# Patient Record
Sex: Female | Born: 1965 | Race: White | Hispanic: No | Marital: Married | Smoking: Current every day smoker
Health system: Southern US, Community
[De-identification: ages and names within clinical notes are randomized; demographics above are authoritative.]

## PROBLEM LIST (undated history)

## (undated) DIAGNOSIS — M549 Dorsalgia, unspecified: Secondary | ICD-10-CM

## (undated) DIAGNOSIS — G8929 Other chronic pain: Secondary | ICD-10-CM

## (undated) DIAGNOSIS — Z720 Tobacco use: Secondary | ICD-10-CM

## (undated) DIAGNOSIS — F1020 Alcohol dependence, uncomplicated: Secondary | ICD-10-CM

## (undated) DIAGNOSIS — M5416 Radiculopathy, lumbar region: Secondary | ICD-10-CM

## (undated) DIAGNOSIS — K7460 Unspecified cirrhosis of liver: Secondary | ICD-10-CM

## (undated) DIAGNOSIS — R17 Unspecified jaundice: Secondary | ICD-10-CM

## (undated) HISTORY — PX: JOINT REPLACEMENT: SHX530

## (undated) HISTORY — PX: TONSILLECTOMY: SUR1361

## (undated) HISTORY — PX: LEG SURGERY: SHX1003

---

## 2016-02-18 ENCOUNTER — Encounter (INDEPENDENT_AMBULATORY_CARE_PROVIDER_SITE_OTHER): Payer: Self-pay

## 2016-02-18 ENCOUNTER — Encounter (INDEPENDENT_AMBULATORY_CARE_PROVIDER_SITE_OTHER): Payer: Self-pay | Admitting: *Deleted

## 2016-02-23 ENCOUNTER — Other Ambulatory Visit (HOSPITAL_COMMUNITY): Payer: Self-pay | Admitting: Family

## 2016-02-23 DIAGNOSIS — Z1231 Encounter for screening mammogram for malignant neoplasm of breast: Secondary | ICD-10-CM

## 2016-03-16 ENCOUNTER — Ambulatory Visit (HOSPITAL_COMMUNITY)
Admission: RE | Admit: 2016-03-16 | Discharge: 2016-03-16 | Disposition: A | Discharge status: Home / Self Care | Payer: 59 | Source: Ambulatory Visit | Attending: Family | Admitting: Family

## 2016-03-16 ENCOUNTER — Encounter (HOSPITAL_COMMUNITY): Payer: Self-pay | Admitting: Radiology

## 2016-03-16 DIAGNOSIS — Z1231 Encounter for screening mammogram for malignant neoplasm of breast: Secondary | ICD-10-CM | POA: Diagnosis not present

## 2016-09-08 ENCOUNTER — Emergency Department (HOSPITAL_COMMUNITY)
Admission: EM | Admit: 2016-09-08 | Discharge: 2016-09-08 | Disposition: A | Discharge status: Home / Self Care | Payer: 59 | Attending: Emergency Medicine | Admitting: Emergency Medicine

## 2016-09-08 ENCOUNTER — Emergency Department (HOSPITAL_COMMUNITY): Payer: 59

## 2016-09-08 ENCOUNTER — Encounter (HOSPITAL_COMMUNITY): Payer: Self-pay | Admitting: Emergency Medicine

## 2016-09-08 DIAGNOSIS — Z5181 Encounter for therapeutic drug level monitoring: Secondary | ICD-10-CM | POA: Diagnosis not present

## 2016-09-08 DIAGNOSIS — R791 Abnormal coagulation profile: Secondary | ICD-10-CM | POA: Insufficient documentation

## 2016-09-08 DIAGNOSIS — K7010 Alcoholic hepatitis without ascites: Secondary | ICD-10-CM | POA: Insufficient documentation

## 2016-09-08 DIAGNOSIS — F1010 Alcohol abuse, uncomplicated: Secondary | ICD-10-CM | POA: Diagnosis not present

## 2016-09-08 DIAGNOSIS — F17210 Nicotine dependence, cigarettes, uncomplicated: Secondary | ICD-10-CM | POA: Diagnosis not present

## 2016-09-08 DIAGNOSIS — E8809 Other disorders of plasma-protein metabolism, not elsewhere classified: Secondary | ICD-10-CM

## 2016-09-08 DIAGNOSIS — R2243 Localized swelling, mass and lump, lower limb, bilateral: Secondary | ICD-10-CM | POA: Diagnosis present

## 2016-09-08 LAB — COMPREHENSIVE METABOLIC PANEL
ALT: 42 U/L (ref 14–54)
AST: 121 U/L — ABNORMAL HIGH (ref 15–41)
Albumin: 1.7 g/dL — ABNORMAL LOW (ref 3.5–5.0)
Alkaline Phosphatase: 158 U/L — ABNORMAL HIGH (ref 38–126)
Anion gap: 7 (ref 5–15)
BILIRUBIN TOTAL: 9.8 mg/dL — AB (ref 0.3–1.2)
BUN: <5 mg/dL — ABNORMAL LOW (ref 6–20)
CO2: 28 mmol/L (ref 22–32)
Calcium: 8.1 mg/dL — ABNORMAL LOW (ref 8.9–10.3)
Chloride: 101 mmol/L (ref 101–111)
Creatinine, Ser: 0.47 mg/dL (ref 0.44–1.00)
GFR calc Af Amer: >60 mL/min (ref >60)
Glucose, Bld: 99 mg/dL (ref 65–99)
POTASSIUM: 3.7 mmol/L (ref 3.5–5.1)
Sodium: 136 mmol/L (ref 135–145)
TOTAL PROTEIN: 6.4 g/dL — AB (ref 6.5–8.1)

## 2016-09-08 LAB — PROTIME-INR
INR: 2.55
Prothrombin Time: 27.9 seconds — ABNORMAL HIGH (ref 11.4–15.2)

## 2016-09-08 LAB — CBC WITH DIFFERENTIAL/PLATELET
BASOS PCT: 2 %
Basophils Absolute: 0.1 10*3/uL (ref 0.0–0.1)
EOS ABS: 0.2 10*3/uL (ref 0.0–0.7)
Eosinophils Relative: 4 %
HEMATOCRIT: 36.1 % (ref 36.0–46.0)
HEMOGLOBIN: 11.7 g/dL — AB (ref 12.0–15.0)
Lymphocytes Relative: 10 %
Lymphs Abs: 0.5 10*3/uL — ABNORMAL LOW (ref 0.7–4.0)
MCH: 30.7 pg (ref 26.0–34.0)
MCHC: 32.4 g/dL (ref 30.0–36.0)
MCV: 94.8 fL (ref 78.0–100.0)
Monocytes Absolute: 1.4 10*3/uL — ABNORMAL HIGH (ref 0.1–1.0)
Monocytes Relative: 26 %
NEUTROS ABS: 3.1 10*3/uL (ref 1.7–7.7)
NEUTROS PCT: 58 %
Platelets: 247 10*3/uL (ref 150–400)
RBC: 3.81 MIL/uL — ABNORMAL LOW (ref 3.87–5.11)
RDW: 18.4 % — AB (ref 11.5–15.5)
WBC: 5.3 10*3/uL (ref 4.0–10.5)

## 2016-09-08 LAB — URINALYSIS, ROUTINE W REFLEX MICROSCOPIC
Glucose, UA: NEGATIVE mg/dL
HGB URINE DIPSTICK: NEGATIVE
Ketones, ur: NEGATIVE mg/dL
Leukocytes, UA: NEGATIVE
Nitrite: NEGATIVE
PH: 8 (ref 5.0–8.0)
PROTEIN: NEGATIVE mg/dL
Specific Gravity, Urine: 1.013 (ref 1.005–1.030)

## 2016-09-08 LAB — ETHANOL: Alcohol, Ethyl (B): <5 mg/dL (ref <5)

## 2016-09-08 LAB — LIPASE, BLOOD: LIPASE: 59 U/L — AB (ref 11–51)

## 2016-09-08 LAB — RAPID URINE DRUG SCREEN, HOSP PERFORMED
Amphetamines: NOT DETECTED
BARBITURATES: NOT DETECTED
BENZODIAZEPINES: NOT DETECTED
Cocaine: NOT DETECTED
OPIATES: NOT DETECTED
Tetrahydrocannabinol: NOT DETECTED

## 2016-09-08 LAB — CK: CK TOTAL: 691 U/L — AB (ref 38–234)

## 2016-09-08 NOTE — ED Triage Notes (Signed)
Pt reports being sent for abnormal labs with swelling in legs and abdomen.  Abnormal labs were bilirubin, AST, and CK.

## 2016-09-08 NOTE — ED Provider Notes (Signed)
[facility]-EMERGENCY DEPT Provider Note   CSN: [id number] Arrival date & time: 09/08/16  0813  By signing my name below, I, Whitney Mathis, attest that this documentation has been prepared under the direction and in the presence of Mancel BaleWentz, Lynn Recendiz, MD . Electronically Signed: Teofilo PodMatthew P. Mathis, ED Scribe. 09/08/2016. 8:54 AM.    History   Chief Complaint Chief Complaint  Patient presents with  . Leg Swelling    The history is provided by the patient. No language interpreter was used.   HPI Comments:  Whitney Mathis is a 51 y.o. female who presents to the Emergency Department, here to follow up after having abnormal labs at her PCP 3 days ago. Pt reports that she was referred to the ED after having abnormal bilirubin, AST and CK labs. Husband reports that pt regularly has leg and abdominal swelling, and she takes gabapentin regularly. She states that the swelling in her left leg has improved. Pt reports being told that she had Hep B 6 months ago, but then tested negative. She states that she is otherwise healthy, smokes 2 cigarettes/day, and drinks vodka daily. Denies fever, nausea, vomiting, cough, chest pain, abdominal pain, back pain, dizziness.   History reviewed. No pertinent past medical history.  There are no active problems to display for this patient.   Past Surgical History:  Procedure Laterality Date  . JOINT REPLACEMENT    . TONSILLECTOMY      OB History    No data available       Home Medications    Prior to Admission medications   Not on File    Family History History reviewed. No pertinent family history.  Social History Social History  Substance Use Topics  . Smoking status: Current Every Day Smoker    Packs/day: 0.50    Types: Cigarettes  . Smokeless tobacco: Never Used  . Alcohol use Yes     Comment: hx heavy etoh use, reduced recently     Allergies   Codeine   Review of Systems Review of Systems  Constitutional: Negative for fever.    Respiratory: Negative for cough.   Cardiovascular: Positive for leg swelling. Negative for chest pain.  Gastrointestinal: Negative for abdominal pain, diarrhea, nausea and vomiting.  Genitourinary: Negative for dysuria.  Neurological: Negative for dizziness.  All other systems reviewed and are negative.    Physical Exam Updated Vital Signs BP 116/68 (BP Location: Left Arm)   Pulse 70   Temp 97.8 F (36.6 C) (Oral)   Resp 15   Wt 75.8 kg (167 lb)   SpO2 98%   Physical Exam  Constitutional: She is oriented to person, place, and time. She appears well-developed and well-nourished. No distress.  HENT:  Head: Normocephalic and atraumatic.  Right Ear: External ear normal.  Left Ear: External ear normal.  Nose: Nose normal.  Mouth/Throat: Oropharynx is clear and moist.  Eyes: Conjunctivae and EOM are normal. Pupils are equal, round, and reactive to light. Scleral icterus is present.  Neck: Normal range of motion and phonation normal. Neck supple.  Cardiovascular: Normal rate and regular rhythm.   Pulmonary/Chest: Effort normal and breath sounds normal. No respiratory distress. She has no wheezes. She has no rales. She exhibits no tenderness.  Abdominal: Soft. She exhibits no distension. There is no tenderness. There is no rebound and no guarding.  Hyperactive bowel sounds.  No palpable liver edge or right upper quadrant tenderness.  No clear evidence for ascites.  Musculoskeletal: Normal range of motion.  3+ pitting edema to right leg. 2+ pitting edema to left leg.   Neurological: She is alert and oriented to person, place, and time. She exhibits normal muscle tone.  No dysarthria, aphasia or nystagmus.  No asterixis.  Skin: Skin is warm and dry. She is not diaphoretic.  Vague red rash arms and torso, characterized by blanching red, macules, 3-4 mm, irregular shaped.  Psychiatric: She has a normal mood and affect. Her behavior is normal. Judgment and thought content normal.   Nursing note and vitals reviewed.    ED Treatments / Results  DIAGNOSTIC STUDIES:  Oxygen Saturation is 99% on RA, normal by my interpretation.    COORDINATION OF CARE:  8:41 AM Discussed treatment plan with pt at bedside and pt agreed to plan.   Labs (all labs ordered are listed, but only abnormal results are displayed) Labs Reviewed  COMPREHENSIVE METABOLIC PANEL - Abnormal; Notable for the following:       Result Value   BUN <5 (*)    Calcium 8.1 (*)    Total Protein 6.4 (*)    Albumin 1.7 (*)    AST 121 (*)    Alkaline Phosphatase 158 (*)    Total Bilirubin 9.8 (*)    All other components within normal limits  LIPASE, BLOOD - Abnormal; Notable for the following:    Lipase 59 (*)    All other components within normal limits  CBC WITH DIFFERENTIAL/PLATELET - Abnormal; Notable for the following:    RBC 3.81 (*)    Hemoglobin 11.7 (*)    RDW 18.4 (*)    Lymphs Abs 0.5 (*)    Monocytes Absolute 1.4 (*)    All other components within normal limits  URINALYSIS, ROUTINE W REFLEX MICROSCOPIC - Abnormal; Notable for the following:    Color, Urine AMBER (*)    Bilirubin Urine MODERATE (*)    All other components within normal limits  PROTIME-INR - Abnormal; Notable for the following:    Prothrombin Time 27.9 (*)    All other components within normal limits  CK - Abnormal; Notable for the following:    Total CK 691 (*)    All other components within normal limits  RAPID URINE DRUG SCREEN, HOSP PERFORMED  ETHANOL    EKG  EKG Interpretation  Date/Time:  Thursday Sep 08 2016 09:05:53 EDT Ventricular Rate:  75 PR Interval:    QRS Duration: 117 QT Interval:  428 QTC Calculation: 479 R Axis:   78 Text Interpretation:  Sinus rhythm Incomplete right bundle branch block No old tracing to compare Confirmed by Mancel Bale [phone]) on 09/08/2016 10:00:51 AM       Radiology Dg Chest 2 View  Result Date: 09/08/2016 CLINICAL DATA:  Bilateral lower extremity swelling.  EXAM: CHEST  2 VIEW COMPARISON:  None. FINDINGS: No pneumothorax. Cardiomegaly identified. The hila and mediastinum are normal. No pulmonary nodules or masses. Probable scar or atelectasis in the left mid lung. No overt edema. IMPRESSION: Cardiomegaly.  No overt edema. Electronically Signed   By: Gerome Sam III M.D   On: 09/08/2016 09:52    Procedures Procedures (including critical care time)  Medications Ordered in ED Medications - No data to display   Initial Impression / Assessment and Plan / ED Course  I have reviewed the triage vital signs and the nursing notes.  Pertinent labs & imaging results that were available during my care of the patient were reviewed by me and considered in my medical decision making (  see chart for details).      Patient Vitals for the past 24 hrs:  BP Pulse Resp SpO2  09/08/16 1032 116/68 70 15 98 %  09/08/16 1000 115/75 65 15 98 %  09/08/16 0930 115/70 73 18 98 %  09/08/16 0830 112/66 74 - 97 %    At discharge- reevaluation with update and discussion. After initial assessment and treatment, an updated evaluation reveals she remains comfortable and has no further complaints.  Findings discussed with the patient.  She agrees that she needs to stop using alcohol.  All questions answered.Mancel Bale L    Final Clinical Impressions(s) / ED Diagnoses   Final diagnoses:  Hyperbilirubinemia  Alcohol abuse  Hypoalbuminemia  Alcoholic hepatitis, unspecified whether ascites present  Prolonged INR   Alcohol liver disease, with hyperbilirubinemia, and prolonged INR.  Likely has some cirrhosis with ascites.  Patient is nontoxic.  Doubt serious bacterial infection metabolic instability or impending vascular collapse.  Nursing Notes Reviewed/ Care Coordinated Applicable Imaging Reviewed Interpretation of Laboratory Data incorporated into ED treatment  The patient appears reasonably screened and/or stabilized for discharge and I doubt any other  medical condition or other [facility] requiring further screening, evaluation, or treatment in the ED at this time prior to discharge.  Plan: Home Medications-ibuprofen for pain; Home Treatments-rest, avoid alcohol, try to eat 3 well-balanced meals each day; return here if the recommended treatment, does not improve the symptoms; Recommended follow up-PCP for checkup 1 week    New Prescriptions There are no discharge medications for this patient. I personally performed the services described in this documentation, which was scribed in my presence. The recorded information has been reviewed and is accurate.     Mancel Bale, MD 09/09/16 [phone]

## 2016-09-08 NOTE — Discharge Instructions (Signed)
It is important to avoid all forms of alcohol.  Try to eat 3 regular meals each day.  Return here, if needed, for problems.

## 2016-12-13 ENCOUNTER — Emergency Department (HOSPITAL_COMMUNITY): Payer: 59

## 2016-12-13 ENCOUNTER — Encounter (HOSPITAL_COMMUNITY): Payer: Self-pay | Admitting: Emergency Medicine

## 2016-12-13 ENCOUNTER — Observation Stay (HOSPITAL_COMMUNITY)
Admission: EM | Admit: 2016-12-13 | Discharge: 2016-12-14 | Disposition: A | Discharge status: Home / Self Care | Payer: 59 | Attending: Family Medicine | Admitting: Family Medicine

## 2016-12-13 DIAGNOSIS — F17210 Nicotine dependence, cigarettes, uncomplicated: Secondary | ICD-10-CM | POA: Insufficient documentation

## 2016-12-13 DIAGNOSIS — K7460 Unspecified cirrhosis of liver: Secondary | ICD-10-CM | POA: Diagnosis not present

## 2016-12-13 DIAGNOSIS — Z716 Tobacco abuse counseling: Secondary | ICD-10-CM | POA: Diagnosis not present

## 2016-12-13 DIAGNOSIS — K0889 Other specified disorders of teeth and supporting structures: Secondary | ICD-10-CM | POA: Diagnosis present

## 2016-12-13 DIAGNOSIS — K047 Periapical abscess without sinus: Principal | ICD-10-CM | POA: Diagnosis present

## 2016-12-13 DIAGNOSIS — E872 Acidosis, unspecified: Secondary | ICD-10-CM

## 2016-12-13 DIAGNOSIS — K7031 Alcoholic cirrhosis of liver with ascites: Secondary | ICD-10-CM | POA: Diagnosis present

## 2016-12-13 DIAGNOSIS — Z79899 Other long term (current) drug therapy: Secondary | ICD-10-CM | POA: Insufficient documentation

## 2016-12-13 DIAGNOSIS — F17200 Nicotine dependence, unspecified, uncomplicated: Secondary | ICD-10-CM | POA: Diagnosis present

## 2016-12-13 HISTORY — DX: Unspecified cirrhosis of liver: K74.60

## 2016-12-13 HISTORY — DX: Alcohol dependence, uncomplicated: F10.20

## 2016-12-13 LAB — I-STAT CG4 LACTIC ACID, ED: Lactic Acid, Venous: 2.27 mmol/L (ref 0.5–1.9)

## 2016-12-13 LAB — CBC WITH DIFFERENTIAL/PLATELET
BASOS PCT: 1 %
Basophils Absolute: 0.1 10*3/uL (ref 0.0–0.1)
EOS PCT: 3 %
Eosinophils Absolute: 0.1 10*3/uL (ref 0.0–0.7)
HCT: 35.7 % — ABNORMAL LOW (ref 36.0–46.0)
HEMOGLOBIN: 11.5 g/dL — AB (ref 12.0–15.0)
LYMPHS ABS: 0.7 10*3/uL (ref 0.7–4.0)
LYMPHS PCT: 13 %
MCH: 30.3 pg (ref 26.0–34.0)
MCHC: 32.2 g/dL (ref 30.0–36.0)
MCV: 94.2 fL (ref 78.0–100.0)
MONO ABS: 1 10*3/uL (ref 0.1–1.0)
Monocytes Relative: 18 %
NEUTROS ABS: 3.7 10*3/uL (ref 1.7–7.7)
Neutrophils Relative %: 65 %
Platelets: 197 10*3/uL (ref 150–400)
RBC: 3.79 MIL/uL — ABNORMAL LOW (ref 3.87–5.11)
RDW: 18.4 % — AB (ref 11.5–15.5)
WBC: 5.5 10*3/uL (ref 4.0–10.5)

## 2016-12-13 LAB — LACTIC ACID, PLASMA
Lactic Acid, Venous: 1.4 mmol/L (ref 0.5–1.9)
Lactic Acid, Venous: 1.6 mmol/L (ref 0.5–1.9)

## 2016-12-13 LAB — URINALYSIS, ROUTINE W REFLEX MICROSCOPIC
Bilirubin Urine: NEGATIVE
GLUCOSE, UA: NEGATIVE mg/dL
Hgb urine dipstick: NEGATIVE
KETONES UR: NEGATIVE mg/dL
LEUKOCYTES UA: NEGATIVE
NITRITE: NEGATIVE
PH: 7 (ref 5.0–8.0)
Protein, ur: NEGATIVE mg/dL
Specific Gravity, Urine: 1.005 (ref 1.005–1.030)

## 2016-12-13 LAB — COMPREHENSIVE METABOLIC PANEL
ALBUMIN: 2.1 g/dL — AB (ref 3.5–5.0)
ALK PHOS: 131 U/L — AB (ref 38–126)
ALT: 44 U/L (ref 14–54)
AST: 119 U/L — ABNORMAL HIGH (ref 15–41)
Anion gap: 5 (ref 5–15)
BUN: <5 mg/dL — ABNORMAL LOW (ref 6–20)
CHLORIDE: 109 mmol/L (ref 101–111)
CO2: 23 mmol/L (ref 22–32)
Calcium: 8.2 mg/dL — ABNORMAL LOW (ref 8.9–10.3)
Creatinine, Ser: 0.3 mg/dL — ABNORMAL LOW (ref 0.44–1.00)
GFR calc Af Amer: >60 mL/min (ref >60)
GFR calc non Af Amer: >60 mL/min (ref >60)
Glucose, Bld: 103 mg/dL — ABNORMAL HIGH (ref 65–99)
POTASSIUM: 4 mmol/L (ref 3.5–5.1)
Sodium: 137 mmol/L (ref 135–145)
Total Bilirubin: 5.5 mg/dL — ABNORMAL HIGH (ref 0.3–1.2)
Total Protein: 6.3 g/dL — ABNORMAL LOW (ref 6.5–8.1)

## 2016-12-13 LAB — PROTIME-INR
INR: 1.86
Prothrombin Time: 21.3 seconds — ABNORMAL HIGH (ref 11.4–15.2)

## 2016-12-13 MED ORDER — GABAPENTIN 300 MG PO CAPS
ORAL_CAPSULE | ORAL | Status: AC
  Filled 2016-12-13: qty 2

## 2016-12-13 MED ORDER — ACETAMINOPHEN 325 MG PO TABS: 650.0000 mg | ORAL_TABLET | Freq: Four times a day (QID) | ORAL | Status: DC | PRN

## 2016-12-13 MED ORDER — DOCUSATE SODIUM 100 MG PO CAPS
100.0000 mg | ORAL_CAPSULE | Freq: Two times a day (BID) | ORAL | Status: DC
  Administered 2016-12-13 – 2016-12-14 (×2): 100 mg via ORAL
  Filled 2016-12-13 (×2): qty 1

## 2016-12-13 MED ORDER — ACETAMINOPHEN 650 MG RE SUPP: 650.0000 mg | Freq: Four times a day (QID) | RECTAL | Status: DC | PRN

## 2016-12-13 MED ORDER — PIPERACILLIN-TAZOBACTAM 3.375 G IVPB: 3.3750 g | Freq: Three times a day (TID) | INTRAVENOUS | Status: DC

## 2016-12-13 MED ORDER — OXYCODONE-ACETAMINOPHEN 5-325 MG PO TABS
1.0000 | ORAL_TABLET | Freq: Once | ORAL | Status: AC
  Administered 2016-12-13: 1 via ORAL
  Filled 2016-12-13: qty 1

## 2016-12-13 MED ORDER — IOPAMIDOL (ISOVUE-300) INJECTION 61%
75.0000 mL | Freq: Once | INTRAVENOUS | Status: AC | PRN
  Administered 2016-12-13: 75 mL via INTRAVENOUS

## 2016-12-13 MED ORDER — KETOROLAC TROMETHAMINE 30 MG/ML IJ SOLN
30.0000 mg | Freq: Four times a day (QID) | INTRAMUSCULAR | Status: DC | PRN
  Administered 2016-12-14: 30 mg via INTRAVENOUS
  Filled 2016-12-13: qty 1

## 2016-12-13 MED ORDER — SODIUM CHLORIDE 0.9 % IV BOLUS (SEPSIS)
1000.0000 mL | Freq: Once | INTRAVENOUS | Status: AC
  Administered 2016-12-13: 1000 mL via INTRAVENOUS

## 2016-12-13 MED ORDER — FUROSEMIDE 40 MG PO TABS
40.0000 mg | ORAL_TABLET | Freq: Every day | ORAL | Status: DC
  Administered 2016-12-13 – 2016-12-14 (×2): 40 mg via ORAL
  Filled 2016-12-13 (×2): qty 1

## 2016-12-13 MED ORDER — SODIUM CHLORIDE 0.9 % IV BOLUS (SEPSIS)
250.0000 mL | Freq: Once | INTRAVENOUS | Status: AC
  Administered 2016-12-13: 250 mL via INTRAVENOUS

## 2016-12-13 MED ORDER — CLINDAMYCIN PHOSPHATE 600 MG/50ML IV SOLN
600.0000 mg | Freq: Once | INTRAVENOUS | Status: AC
  Administered 2016-12-13: 600 mg via INTRAVENOUS
  Filled 2016-12-13: qty 50

## 2016-12-13 MED ORDER — ONDANSETRON HCL 4 MG/2ML IJ SOLN: 4.0000 mg | Freq: Four times a day (QID) | INTRAMUSCULAR | Status: DC | PRN

## 2016-12-13 MED ORDER — SODIUM CHLORIDE 0.9 % IV SOLN
INTRAVENOUS | Status: AC
  Filled 2016-12-13: qty 3

## 2016-12-13 MED ORDER — GABAPENTIN 600 MG PO TABS
600.0000 mg | ORAL_TABLET | Freq: Two times a day (BID) | ORAL | Status: DC
  Administered 2016-12-14: 600 mg via ORAL
  Filled 2016-12-13 (×2): qty 1

## 2016-12-13 MED ORDER — ONDANSETRON HCL 4 MG PO TABS: 4.0000 mg | ORAL_TABLET | Freq: Four times a day (QID) | ORAL | Status: DC | PRN

## 2016-12-13 MED ORDER — ENOXAPARIN SODIUM 40 MG/0.4ML ~~LOC~~ SOLN
40.0000 mg | SUBCUTANEOUS | Status: DC
  Administered 2016-12-13: 40 mg via SUBCUTANEOUS
  Filled 2016-12-13: qty 0.4

## 2016-12-13 MED ORDER — SODIUM CHLORIDE 0.9 % IV BOLUS (SEPSIS)
500.0000 mL | Freq: Once | INTRAVENOUS | Status: AC
  Administered 2016-12-13: 500 mL via INTRAVENOUS

## 2016-12-13 MED ORDER — VANCOMYCIN HCL 10 G IV SOLR
1250.0000 mg | Freq: Once | INTRAVENOUS | Status: AC
  Administered 2016-12-13: 1250 mg via INTRAVENOUS
  Filled 2016-12-13: qty 1250

## 2016-12-13 MED ORDER — VANCOMYCIN HCL IN DEXTROSE 1-5 GM/200ML-% IV SOLN: 1000.0000 mg | Freq: Two times a day (BID) | INTRAVENOUS | Status: DC

## 2016-12-13 MED ORDER — PIPERACILLIN-TAZOBACTAM 3.375 G IVPB 30 MIN
3.3750 g | Freq: Once | INTRAVENOUS | Status: AC
  Administered 2016-12-13: 3.375 g via INTRAVENOUS
  Filled 2016-12-13: qty 50

## 2016-12-13 MED ORDER — OXYCODONE HCL 5 MG PO TABS
5.0000 mg | ORAL_TABLET | Freq: Four times a day (QID) | ORAL | Status: DC | PRN
  Administered 2016-12-13 – 2016-12-14 (×2): 5 mg via ORAL
  Filled 2016-12-13 (×2): qty 1

## 2016-12-13 MED ORDER — LACTATED RINGERS IV SOLN
INTRAVENOUS | Status: DC
  Administered 2016-12-13: 23:00:00 via INTRAVENOUS

## 2016-12-13 MED ORDER — VANCOMYCIN HCL IN DEXTROSE 1-5 GM/200ML-% IV SOLN
1000.0000 mg | Freq: Once | INTRAVENOUS | Status: DC
  Filled 2016-12-13: qty 200

## 2016-12-13 MED ORDER — SODIUM CHLORIDE 0.9 % IV SOLN
3.0000 g | Freq: Three times a day (TID) | INTRAVENOUS | Status: DC
  Administered 2016-12-14: 3 g via INTRAVENOUS
  Filled 2016-12-13 (×5): qty 3

## 2016-12-13 NOTE — ED Notes (Signed)
Pt requesting to speak to someone about their wait time for a bed; this RN went in to speak with pt and family member; pt's family member is very upset and states if they are not going upstairs soon they are leaving and going to [facility]; Almira Coaster [facility] informed of need to speak with pt

## 2016-12-13 NOTE — ED Provider Notes (Signed)
[facility]-EMERGENCY DEPT Provider Note   CSN: [id number] Arrival date & time: 12/13/16  1132     History   Chief Complaint Chief Complaint  Patient presents with  . Dental Pain    HPI Whitney Mathis is a 51 y.o. female.  HPI  This is a 51 year old female history of cirrhosis presents today complaining of dental pain for the past week. She has a history of poor dentition and dental caries. She's been having pain at the site of tooth #11 which is decayed to the gumline. Surrounding teeth have multiple caries. She reports it has been tender and she has been on penicillin for the past week. She is almost completed the penicillin. This was prescribed by her primary care doctor. She states she's had some redness in the face and cheek overlying this with some swelling. She feels it has gotten somewhat worse. She denies any fever or chills. She is not having any difficulty speaking, swallowing, or breathing. She does not have a dentist or oral Careers adviser.  Past Medical History:  Diagnosis Date  . Cirrhosis (HCC)     There are no active problems to display for this patient.   Past Surgical History:  Procedure Laterality Date  . JOINT REPLACEMENT    . LEG SURGERY     right  . TONSILLECTOMY      OB History    No data available       Home Medications    Prior to Admission medications   Medication Sig Start Date End Date Taking? Authorizing Provider  furosemide (LASIX) 40 MG tablet Take 40 mg by mouth daily.  12/01/16  Yes [provider]  gabapentin (NEURONTIN) 600 MG tablet Take one tablet twice a day. 12/01/16  Yes [provider]  oxyCODONE (OXY IR/ROXICODONE) 5 MG immediate release tablet Take one three times a day as needed for pain. 12/06/16  Yes [provider]    Family History No family history on file.  Social History Social History  Substance Use Topics  . Smoking status: Current Every Day Smoker    Packs/day: 0.50    Types: Cigarettes  .  Smokeless tobacco: Never Used  . Alcohol use Yes     Comment: None in 4 months     Allergies   Codeine   Review of Systems Review of Systems  All other systems reviewed and are negative.    Physical Exam Updated Vital Signs BP (!) 142/89 (BP Location: Right Arm)   Pulse (!) 112   Temp 98.2 F (36.8 C) (Temporal)   Resp 18   Ht 1.626 m (5\' 4" )   Wt 71.7 kg (158 lb)   SpO2 100%   BMI 27.12 kg/m   Physical Exam  Constitutional: She is oriented to person, place, and time. She appears well-developed and well-nourished. No distress.  HENT:  Head: Normocephalic and atraumatic.    Mouth/Throat:    Erythema with mild swelling and tenderness to palpation  Eyes: Pupils are equal, round, and reactive to light.  Neck: Normal range of motion. Neck supple.  Cardiovascular: Normal rate and regular rhythm.   Murmur heard. Pulmonary/Chest: Effort normal and breath sounds normal.  Abdominal: Soft.  Musculoskeletal: Normal range of motion.  Neurological: She is alert and oriented to person, place, and time.  Skin: Skin is warm and dry. Capillary refill takes less than 2 seconds.  Psychiatric: She has a normal mood and affect.  Nursing note and vitals reviewed.    ED Treatments /  Results  Labs (all labs ordered are listed, but only abnormal results are displayed) Labs Reviewed  CBC WITH DIFFERENTIAL/PLATELET - Abnormal; Notable for the following:       Result Value   RBC 3.79 (*)    Hemoglobin 11.5 (*)    HCT 35.7 (*)    RDW 18.4 (*)    All other components within normal limits  COMPREHENSIVE METABOLIC PANEL - Abnormal; Notable for the following:    Glucose, Bld 103 (*)    BUN <5 (*)    Creatinine, Ser 0.30 (*)    Calcium 8.2 (*)    Total Protein 6.3 (*)    Albumin 2.1 (*)    AST 119 (*)    Alkaline Phosphatase 131 (*)    Total Bilirubin 5.5 (*)    All other components within normal limits  PROTIME-INR - Abnormal; Notable for the following:    Prothrombin Time  21.3 (*)    All other components within normal limits  CULTURE, BLOOD (ROUTINE X 2)  CULTURE, BLOOD (ROUTINE X 2)  I-STAT CG4 LACTIC ACID, ED    EKG  EKG Interpretation  Date/Time:  Tuesday December 13 2016 14:58:22 EDT Ventricular Rate:  94 PR Interval:    QRS Duration: 112 QT Interval:  377 QTC Calculation: 472 R Axis:   -28 Text Interpretation:  Sinus rhythm Borderline intraventricular conduction delay RSR' in V1 or V2, right VCD or RVH Confirmed by Margarita Grizzle [phone]) on 12/13/2016 3:30:48 PM       Radiology Ct Maxillofacial W Contrast  Result Date: 12/13/2016 CLINICAL DATA:  51 y/o  F; 3 days of left upper dental pain. EXAM: CT MAXILLOFACIAL WITH CONTRAST TECHNIQUE: Multidetector CT imaging of the maxillofacial structures was performed with intravenous contrast. Multiplanar CT image reconstructions were also generated. CONTRAST:  75mL ISOVUE-300 IOPAMIDOL (ISOVUE-300) INJECTION 61% COMPARISON:  None. FINDINGS: Osseous: Periapical cyst at the roots of left maxillary canine with cortical defect in the outer table of the maxillary alveolar bone (series 7, image 48). Orbits: Negative. No traumatic or inflammatory finding. Sinuses: Mild left maxillary sinus mucosal thickening and small mucous retention cyst. Normal aeration of the mastoid air cells. Soft tissues: 14 x 11 x 12 mm rim enhancing collection centered over left maxillary alveolar bone associated with the left maxillary canine at the base of left nostril. Limited intracranial: No significant or unexpected finding. IMPRESSION: 14 mm odontogenic abscess within soft tissues overlying the left maxillary alveolar bone at base of left nostril associated with a dehiscent periapical cysts of the left maxillary canine. Electronically Signed   By: Mitzi Hansen M.D.   On: 12/13/2016 14:56    Procedures Procedures (including critical care time)  Medications Ordered in ED Medications  clindamycin (CLEOCIN) IVPB 600 mg (600 mg  Intravenous New Bag/Given 12/13/16 1326)  sodium chloride 0.9 % bolus 500 mL (0 mLs Intravenous Stopped 12/13/16 1325)     Initial Impression / Assessment and Plan / ED Course  I have reviewed the triage vital signs and the nursing notes.  Pertinent labs & imaging results that were available during my care of the patient were reviewed by me and considered in my medical decision making (see chart for details).   labs drawn and IV clindamycin infused. CT face pending to assess for abscess.   Discussed with Dr. Ophelia Charter and plan admission for iv antibiotics and to assess for worsening infection.  Initial lactic acid slightly elevated.  Fluids infusing and will reassess for clearance vs worsening.  Final Clinical Impressions(s) / ED Diagnoses   Final diagnoses:  Dental abscess  Lactic acidosis    New Prescriptions New Prescriptions   No medications on file     Margarita Grizzle, MD 12/15/16 1600

## 2016-12-13 NOTE — Progress Notes (Addendum)
Pharmacy Antibiotic Note  Whitney Mathis is a 51 y.o. female admitted on 12/13/2016 with sepsis.  Pharmacy has been consulted for vancomycin and zosyn dosing. She received 600 mg clindamycin in the ED  Plan: Vancomycin 1250 mg IV X 1 then Vancomycin 1000 IV every 12 hours.  Goal trough 15-20 mcg/mL. Zosyn 3.375g IV q8h (4 hour infusion).  F/u renal function, cultures and clinical course  Height: 5\' 4"  (162.6 cm) Weight: 158 lb (71.7 kg) IBW/kg (Calculated) : 54.7  Temp (24hrs), Avg:98.2 F (36.8 C), Min:98.2 F (36.8 C), Max:98.2 F (36.8 C)   Recent Labs Lab 12/13/16 1307 12/13/16 1440 12/13/16 1501  WBC 5.5  --   --   CREATININE 0.30*  --   --   LATICACIDVEN  --  2.27* 1.4    Estimated Creatinine Clearance: 81.7 mL/min (A) (by C-G formula based on SCr of 0.3 mg/dL (L)).    Allergies  Allergen Reactions  . Codeine Nausea And Vomiting      Thank you for allowing pharmacy to be a part of this patient's care.  Woodfin Ganja 12/13/2016 4:36 PM   Addum:  Change antibiotics to unasyn 3gm IV q8 hours

## 2016-12-13 NOTE — H&P (Addendum)
History and Physical    Whitney Mathis ZOX:[id number] DOB: 1966-01-28 DOA: 12/13/2016  [facility] Personal Consultants:  Drake Leach and Ron Agee - [facility] Patient coming from: Home - lives with husband; NOK: husband, [phone]  Chief Complaint: dental pain  HPI: Whitney Mathis is a 51 y.o. female with medical history significant of alcoholic cirrhosis presenting with dental pain.  Patient with a tooth that has been broken for a long time - maybe 3-4 weeks.  It started swelling and she went to PCP a week ago and was started on PCN and Oxycodone; this did not help at all.  She has not seen a dentist for this.  She remotely saw Dr. Effie Shy to have a tooth cut out but he doesn't take her dental insurance.  She called the PCP office last evening and told her that she wasn't getting better.  It has been getting worse - red, more swollen.  No fever.  She notices mild improvement here in the ER.   ED Course: IV Clindamycin.  Review of Systems: As per HPI; otherwise review of systems reviewed and negative.   Ambulatory Status:  Ambulates without assistance  Past Medical History:  Diagnosis Date  . Alcohol dependence (HCC)    stopped drinking in 4/18  . Cirrhosis [facility])     Past Surgical History:  Procedure Laterality Date  . JOINT REPLACEMENT Right    hip  . LEG SURGERY     right  . TONSILLECTOMY      Social History   Social History  . Marital status: Married    Spouse name: N/A  . Number of children: N/A  . Years of education: N/A   Occupational History  . housewife    Social History Main Topics  . Smoking status: Current Every Day Smoker    Packs/day: 0.25    Types: Cigarettes    Start date: 52  . Smokeless tobacco: Never Used  . Alcohol use Yes     Comment: Quit in 07/2016  . Drug use: No  . Sexual activity: Not on file   Other Topics Concern  . Not on file   Social History Narrative  . No narrative on file    Allergies    Allergen Reactions  . Codeine Nausea And Vomiting    Family History  Problem Relation Age of Onset  . Bone cancer Mother 3    Prior to Admission medications   Medication Sig Start Date End Date Taking? Authorizing Provider  furosemide (LASIX) 40 MG tablet Take 40 mg by mouth daily.  12/01/16  Yes [provider]  gabapentin (NEURONTIN) 600 MG tablet Take one tablet twice a day. 12/01/16  Yes [provider]  oxyCODONE (OXY IR/ROXICODONE) 5 MG immediate release tablet Take one three times a day as needed for pain. 12/06/16  Yes [provider]    Physical Exam: Vitals:   12/13/16 1730 12/13/16 1745 12/13/16 1800 12/13/16 2136  BP: 140/86 134/86 126/87 116/67  Pulse: 89 88 90 95  Resp: 16 17 20 17   Temp:    98.2 F (36.8 C)  TempSrc:    Oral  SpO2: 98% 96% 97% 98%  Weight:      Height:         General:  Appears calm and comfortable and is NAD Eyes:  PERRL, EOMI, normal lids, iris ENT:  grossly normal hearing, lips & tongue, mmm; poor dentition with broken teeth on the right upper jaw and apparent  abscess of the left upper canine Neck:  Shotty left-sided LAD, no masses or thyromegaly; no carotid bruits Cardiovascular:  RRR, 2-3/6 systolic murmur, no r/g. No LE edema.  Respiratory:   CTA bilaterally with no wheezes/rales/rhonchi.  Normal respiratory effort. Abdomen:  soft, NT, ND, NABS, +fluid wave Back:   normal alignment, no CVAT Skin:  Mild facial fullness on the right without apparent erythema Musculoskeletal:  grossly normal tone BUE/BLE, good ROM, no bony abnormality Lower extremity:  No LE edema.  Limited foot exam with no ulcerations.  2+ distal pulses. Psychiatric:  grossly normal mood and affect, speech fluent and appropriate, AOx3 Neurologic:  CN 2-12 grossly intact, moves all extremities in coordinated fashion, sensation intact    Radiological Exams on Admission: Ct Maxillofacial W Contrast  Result Date: 12/13/2016 CLINICAL DATA:   51 y/o  F; 3 days of left upper dental pain. EXAM: CT MAXILLOFACIAL WITH CONTRAST TECHNIQUE: Multidetector CT imaging of the maxillofacial structures was performed with intravenous contrast. Multiplanar CT image reconstructions were also generated. CONTRAST:  19mL ISOVUE-300 IOPAMIDOL (ISOVUE-300) INJECTION 61% COMPARISON:  None. FINDINGS: Osseous: Periapical cyst at the roots of left maxillary canine with cortical defect in the outer table of the maxillary alveolar bone (series 7, image 48). Orbits: Negative. No traumatic or inflammatory finding. Sinuses: Mild left maxillary sinus mucosal thickening and small mucous retention cyst. Normal aeration of the mastoid air cells. Soft tissues: 14 x 11 x 12 mm rim enhancing collection centered over left maxillary alveolar bone associated with the left maxillary canine at the base of left nostril. Limited intracranial: No significant or unexpected finding. IMPRESSION: 14 mm odontogenic abscess within soft tissues overlying the left maxillary alveolar bone at base of left nostril associated with a dehiscent periapical cysts of the left maxillary canine. Electronically Signed   By: Mitzi Hansen M.D.   On: 12/13/2016 14:56    EKG: Independently reviewed.  NSR with rate 94; no evidence of acute ischemia   Labs on Admission: I have personally reviewed the available labs and imaging studies at the time of the admission.  Pertinent labs:   Lactate 2.27, 1.4, 1.6 Negative UA Albumin 2.1 - improved AST 119/ALT 44 - stable Bilirubin 5.5 - improved WBC 5.5 Hgb 11.5 - stable   Assessment/Plan Principal Problem:   Dental abscess Active Problems:   Alcoholic cirrhosis of liver with ascites (HCC)   Dental abscess -Patient presenting with elevated lactate associated with dental abscess -No other concern for sepsis -Will observe with IV antibiotics - she already failed outpatient PCN so will give Unasyn -If improving, she can f/u with oral surgeon as  an outpatient -If not improving, she will require transfer to a facility with this availability -Continue Oxy IR and Tylenol prn pain  Cirrhosis -She has ascites but no jaundice and reports feeling much better -Will check ETOH and UDS -Supportive care only other than continuing home Lasix  Tobacco dependence -Tobacco Dependence: encourage cessation.  This was discussed with the patient and should be reviewed on an ongoing basis.   -Patch declined by patient.   DVT prophylaxis: Lovenox  Code Status: Full - confirmed with patient Family Communication: None present Disposition Plan:  Home once clinically improved Consults called: None  Admission status: It is my clinical opinion that referral for OBSERVATION is reasonable and necessary in this patient based on the above information provided. The aforementioned taken together are felt to place the patient at high risk for further clinical deterioration. However it is anticipated  that the patient may be medically stable for discharge from the hospital within 24 to 48 hours.    Jonah Blue MD [facility]  If note is complete, please contact covering daytime or nighttime physician. [email or web address] Password [facility]  12/13/2016, 11:27 PM

## 2016-12-13 NOTE — ED Triage Notes (Signed)
Dental pain, upper left, on PCN, without relief

## 2016-12-13 NOTE — ED Notes (Signed)
MD states no additional ABX need to be given.

## 2016-12-14 DIAGNOSIS — K047 Periapical abscess without sinus: Secondary | ICD-10-CM | POA: Diagnosis not present

## 2016-12-14 LAB — CBC
HEMATOCRIT: 34.3 % — AB (ref 36.0–46.0)
HEMOGLOBIN: 11 g/dL — AB (ref 12.0–15.0)
MCH: 30.3 pg (ref 26.0–34.0)
MCHC: 32.1 g/dL (ref 30.0–36.0)
MCV: 94.5 fL (ref 78.0–100.0)
PLATELETS: 175 10*3/uL (ref 150–400)
RBC: 3.63 MIL/uL — AB (ref 3.87–5.11)
RDW: 18.2 % — ABNORMAL HIGH (ref 11.5–15.5)
WBC: 5.6 10*3/uL (ref 4.0–10.5)

## 2016-12-14 LAB — RAPID URINE DRUG SCREEN, HOSP PERFORMED
AMPHETAMINES: NOT DETECTED
BARBITURATES: NOT DETECTED
BENZODIAZEPINES: NOT DETECTED
Cocaine: NOT DETECTED
Opiates: NOT DETECTED
TETRAHYDROCANNABINOL: NOT DETECTED

## 2016-12-14 LAB — BASIC METABOLIC PANEL
Anion gap: 6 (ref 5–15)
BUN: <5 mg/dL — ABNORMAL LOW (ref 6–20)
CHLORIDE: 107 mmol/L (ref 101–111)
CO2: 23 mmol/L (ref 22–32)
Calcium: 7.6 mg/dL — ABNORMAL LOW (ref 8.9–10.3)
Creatinine, Ser: 0.41 mg/dL — ABNORMAL LOW (ref 0.44–1.00)
GFR calc Af Amer: >60 mL/min (ref >60)
GFR calc non Af Amer: >60 mL/min (ref >60)
Glucose, Bld: 78 mg/dL (ref 65–99)
POTASSIUM: 3.8 mmol/L (ref 3.5–5.1)
Sodium: 136 mmol/L (ref 135–145)

## 2016-12-14 LAB — ETHANOL: ALCOHOL ETHYL (B): 6 mg/dL — AB (ref <5)

## 2016-12-14 MED ORDER — GABAPENTIN 300 MG PO CAPS
600.0000 mg | ORAL_CAPSULE | Freq: Two times a day (BID) | ORAL | Status: DC
  Administered 2016-12-14: 600 mg via ORAL
  Filled 2016-12-14 (×2): qty 2

## 2016-12-14 MED ORDER — AMOXICILLIN-POT CLAVULANATE 875-125 MG PO TABS: 1.0000 | ORAL_TABLET | Freq: Two times a day (BID) | ORAL | 0 refills | Status: DC

## 2016-12-14 MED ORDER — HYDROCODONE-ACETAMINOPHEN 5-325 MG PO TABS: 1.0000 | ORAL_TABLET | Freq: Four times a day (QID) | ORAL | 0 refills | Status: DC | PRN

## 2016-12-14 NOTE — Progress Notes (Signed)
Pharmacy Antibiotic Note  Whitney Mathis is a 51 y.o. female admitted on 12/13/2016 with sepsis / dental infection.  Pharmacy has been consulted for UNASYN She received 600 mg clindamycin in the ED  Plan: Unasyn 3gm IV q8hrs F/u renal function, cultures and clinical course  Height: 5\' 4"  (162.6 cm) Weight: 158 lb (71.7 kg) IBW/kg (Calculated) : 54.7  Temp (24hrs), Avg:98.2 F (36.8 C), Min:98.2 F (36.8 C), Max:98.2 F (36.8 C)   Recent Labs Lab 12/13/16 1307 12/13/16 1440 12/13/16 1501 12/13/16 1722 12/14/16 0443  WBC 5.5  --   --   --  5.6  CREATININE 0.30*  --   --   --  0.41*  LATICACIDVEN  --  2.27* 1.4 1.6  --     Estimated Creatinine Clearance: 81.7 mL/min (A) (by C-G formula based on SCr of 0.41 mg/dL (L)).    Allergies  Allergen Reactions  . Codeine Nausea And Vomiting   Thank you for allowing pharmacy to be a part of this patient's care.  Valrie HartHall, [place] A 12/14/2016 10:28 AM

## 2016-12-14 NOTE — Progress Notes (Signed)
Both IV's removed.  Both sites clean, dry and intact.  Discharge information / AVS discussed with patient and husband.   New prescriptions given to patient in discharge packet.    Patient wheeled down to car by myself.  Patient stable at time of discharge.

## 2016-12-14 NOTE — Discharge Summary (Signed)
Physician Discharge Summary  Whitney Mathis VOZ:[id number] DOB: 06/01/1965 DOA: 12/13/2016  PCP: [facility]  Admit date: 12/13/2016 Discharge date: 12/14/2016  Recommendations for Outpatient Follow-up:  1. Resolution of dental abscess.  Follow-up Information    [facility] Follow up.   Why:  as needed Contact information: [address] [place] [address] [phone]            Discharge Diagnoses:  1. Dental abscess 2. Cirrhosis  Discharge Condition: Improved Disposition: Home  Diet recommendation: Heart healthy  Filed Weights   12/13/16 1139  Weight: 71.7 kg (158 lb)    History of present illness:  51 year old woman PMH cirrhosis admitted for dental abscess.  Hospital Course:  Patient was started on empiric Unasyn. She had significant clinical improvement with this with decreased pain and swelling. She was discharged home on oral Augmentin and advised to contact her dentist immediately to arrange for definitive management with extraction. I discussed with both her and her husband that antibiotics would not cure this infection and that she requires dental medicine for definitive management. She reports that she has dentist that she can call to assist her with this.  Today's assessment: S: Feels better, less pain, less swelling. No difficulty eating. O: Vitals: Afebrile, 98.2, 18, 94, 128/70, 99% on room air   Constitutional. Appears calm, comfortable.  ENT. There is some edema over the left nasolabial fold, this is tender to palpation. There is no wound or exudate. Mouth there is a carey at the gum left upper mandible. No exudate or swelling of the gum.  Cardiovascular. Regular rate and rhythm. No murmur, rub or gallop. No lower extremity edema.  Respiratory. Clear to auscultation bilaterally. No wheezes, rales or rhonchi. Normal respiratory effort.  Discharge Instructions  Discharge  Instructions    Diet - low sodium heart healthy    Complete by:  As directed    Discharge instructions    Complete by:  As directed    It is critical that you call your dentist to set up tooth care as soon as possible. You need to have your tooth extracted in order to cure this infection. Call your physician or seek immediate medical attention for increased pain, swelling, fever or worsening of condition.   Increase activity slowly    Complete by:  As directed      Allergies as of 12/14/2016      Reactions   Codeine Nausea And Vomiting      Medication List    STOP taking these medications   oxyCODONE 5 MG immediate release tablet Commonly known as:  Oxy IR/ROXICODONE     TAKE these medications   amoxicillin-clavulanate 875-125 MG tablet Commonly known as:  AUGMENTIN Take 1 tablet by mouth 2 (two) times daily.   furosemide 40 MG tablet Commonly known as:  LASIX Take 40 mg by mouth daily.   gabapentin 600 MG tablet Commonly known as:  NEURONTIN Take one tablet twice a day.   HYDROcodone-acetaminophen 5-325 MG tablet Commonly known as:  LORTAB Take 1 tablet by mouth every 6 (six) hours as needed for moderate pain.            Discharge Care Instructions        Start     Ordered   12/14/16 0000  amoxicillin-clavulanate (AUGMENTIN) 875-125 MG tablet  2 times daily     12/14/16 1258   12/14/16 0000  HYDROcodone-acetaminophen (LORTAB) 5-325 MG tablet  Every 6 hours PRN     12/14/16 1327   12/14/16 0000  Increase activity slowly     12/14/16 1329   12/14/16 0000  Diet - low sodium heart healthy     12/14/16 1329   12/14/16 0000  Discharge instructions    Comments:  It is critical that you call your dentist to set up tooth care as soon as possible. You need to have your tooth extracted in order to cure this infection. Call your physician or seek immediate medical attention for increased pain, swelling, fever or worsening of condition.   12/14/16 1329     Allergies    Allergen Reactions  . Codeine Nausea And Vomiting    The results of significant diagnostics from this hospitalization (including imaging, microbiology, ancillary and laboratory) are listed below for reference.    Significant Diagnostic Studies: Ct Maxillofacial W Contrast  Result Date: 12/13/2016 CLINICAL DATA:  51 y/o  F; 3 days of left upper dental pain. EXAM: CT MAXILLOFACIAL WITH CONTRAST TECHNIQUE: Multidetector CT imaging of the maxillofacial structures was performed with intravenous contrast. Multiplanar CT image reconstructions were also generated. CONTRAST:  75mL ISOVUE-300 IOPAMIDOL (ISOVUE-300) INJECTION 61% COMPARISON:  None. FINDINGS: Osseous: Periapical cyst at the roots of left maxillary canine with cortical defect in the outer table of the maxillary alveolar bone (series 7, image 48). Orbits: Negative. No traumatic or inflammatory finding. Sinuses: Mild left maxillary sinus mucosal thickening and small mucous retention cyst. Normal aeration of the mastoid air cells. Soft tissues: 14 x 11 x 12 mm rim enhancing collection centered over left maxillary alveolar bone associated with the left maxillary canine at the base of left nostril. Limited intracranial: No significant or unexpected finding. IMPRESSION: 14 mm odontogenic abscess within soft tissues overlying the left maxillary alveolar bone at base of left nostril associated with a dehiscent periapical cysts of the left maxillary canine. Electronically Signed   By: Mitzi HansenLance  Furusawa-Stratton M.D.   On: 12/13/2016 14:56    Microbiology: Recent Results (from the past 240 hour(s))  Blood culture (routine x 2)     Status: None (Preliminary result)   Collection Time: 12/13/16  1:07 PM  Result Value Ref Range Status   Specimen Description LEFT ANTECUBITAL  Final   Special Requests   Final    BOTTLES DRAWN AEROBIC ONLY Blood Culture results may not be optimal due to an inadequate volume of blood received in culture bottles   Culture NO  GROWTH < 24 HOURS  Final   Report Status PENDING  Incomplete  Blood culture (routine x 2)     Status: None (Preliminary result)   Collection Time: 12/13/16  1:07 PM  Result Value Ref Range Status   Specimen Description BLOOD LEFT ARM  Final   Special Requests   Final    BOTTLES DRAWN AEROBIC ONLY Blood Culture results may not be optimal due to an inadequate volume of blood received in culture bottles   Culture NO GROWTH < 24 HOURS  Final   Report Status PENDING  Incomplete     Labs: Basic Metabolic Panel:  Recent Labs Lab 12/13/16 1307 12/14/16 0443  NA 137 136  K 4.0 3.8  CL 109 107  CO2 23 23  GLUCOSE 103* 78  BUN <5* <5*  CREATININE 0.30* 0.41*  CALCIUM 8.2* 7.6*   Liver Function Tests:  Recent Labs Lab 12/13/16 1307  AST 119*  ALT 44  ALKPHOS 131*  BILITOT 5.5*  PROT 6.3*  ALBUMIN 2.1*  CBC:  Recent Labs Lab 12/13/16 1307 12/14/16 0443  WBC 5.5 5.6  NEUTROABS 3.7  --   HGB 11.5* 11.0*  HCT 35.7* 34.3*  MCV 94.2 94.5  PLT 197 175    Principal Problem:   Dental abscess Active Problems:   Alcoholic cirrhosis of liver with ascites (HCC)   Tobacco dependence   Time coordinating discharge: 35 minutes  Signed:  Brendia Sacks, MD [facility] 12/14/2016, 6:05 PM

## 2016-12-15 LAB — HIV ANTIBODY (ROUTINE TESTING W REFLEX): HIV Screen 4th Generation wRfx: NONREACTIVE

## 2016-12-18 LAB — CULTURE, BLOOD (ROUTINE X 2)
CULTURE: NO GROWTH
Culture: NO GROWTH

## 2017-06-25 ENCOUNTER — Inpatient Hospital Stay (HOSPITAL_COMMUNITY): Payer: BLUE CROSS/BLUE SHIELD

## 2017-06-25 ENCOUNTER — Inpatient Hospital Stay
Admission: AD | Admit: 2017-06-25 | Payer: Self-pay | Source: Other Acute Inpatient Hospital | Admitting: Pulmonary Disease

## 2017-06-25 ENCOUNTER — Encounter (HOSPITAL_COMMUNITY): Payer: Self-pay | Admitting: Emergency Medicine

## 2017-06-25 ENCOUNTER — Emergency Department (HOSPITAL_COMMUNITY): Payer: BLUE CROSS/BLUE SHIELD

## 2017-06-25 ENCOUNTER — Inpatient Hospital Stay (HOSPITAL_COMMUNITY): Payer: BLUE CROSS/BLUE SHIELD | Admitting: Anesthesiology

## 2017-06-25 ENCOUNTER — Inpatient Hospital Stay (HOSPITAL_COMMUNITY)
Admission: EM | Admit: 2017-06-25 | Discharge: 2017-07-17 | DRG: 853 | Disposition: E | Payer: BLUE CROSS/BLUE SHIELD | Attending: Emergency Medicine | Admitting: Emergency Medicine

## 2017-06-25 ENCOUNTER — Encounter (HOSPITAL_COMMUNITY): Admission: EM | Disposition: E | Payer: Self-pay | Source: Home / Self Care | Attending: Critical Care Medicine

## 2017-06-25 ENCOUNTER — Other Ambulatory Visit: Payer: Self-pay

## 2017-06-25 DIAGNOSIS — K7201 Acute and subacute hepatic failure with coma: Secondary | ICD-10-CM

## 2017-06-25 DIAGNOSIS — D62 Acute posthemorrhagic anemia: Secondary | ICD-10-CM | POA: Diagnosis not present

## 2017-06-25 DIAGNOSIS — I342 Nonrheumatic mitral (valve) stenosis: Secondary | ICD-10-CM | POA: Diagnosis not present

## 2017-06-25 DIAGNOSIS — J9602 Acute respiratory failure with hypercapnia: Secondary | ICD-10-CM

## 2017-06-25 DIAGNOSIS — A419 Sepsis, unspecified organism: Principal | ICD-10-CM | POA: Diagnosis present

## 2017-06-25 DIAGNOSIS — I743 Embolism and thrombosis of arteries of the lower extremities: Secondary | ICD-10-CM | POA: Diagnosis present

## 2017-06-25 DIAGNOSIS — I998 Other disorder of circulatory system: Secondary | ICD-10-CM | POA: Diagnosis not present

## 2017-06-25 DIAGNOSIS — G8929 Other chronic pain: Secondary | ICD-10-CM

## 2017-06-25 DIAGNOSIS — D649 Anemia, unspecified: Secondary | ICD-10-CM | POA: Diagnosis not present

## 2017-06-25 DIAGNOSIS — K7290 Hepatic failure, unspecified without coma: Secondary | ICD-10-CM | POA: Diagnosis not present

## 2017-06-25 DIAGNOSIS — I21A1 Myocardial infarction type 2: Secondary | ICD-10-CM | POA: Diagnosis present

## 2017-06-25 DIAGNOSIS — I4891 Unspecified atrial fibrillation: Secondary | ICD-10-CM | POA: Diagnosis present

## 2017-06-25 DIAGNOSIS — I10 Essential (primary) hypertension: Secondary | ICD-10-CM | POA: Diagnosis not present

## 2017-06-25 DIAGNOSIS — D684 Acquired coagulation factor deficiency: Secondary | ICD-10-CM | POA: Diagnosis not present

## 2017-06-25 DIAGNOSIS — E875 Hyperkalemia: Secondary | ICD-10-CM | POA: Diagnosis present

## 2017-06-25 DIAGNOSIS — Z66 Do not resuscitate: Secondary | ICD-10-CM | POA: Diagnosis not present

## 2017-06-25 DIAGNOSIS — Z9911 Dependence on respirator [ventilator] status: Secondary | ICD-10-CM

## 2017-06-25 DIAGNOSIS — Z791 Long term (current) use of non-steroidal anti-inflammatories (NSAID): Secondary | ICD-10-CM

## 2017-06-25 DIAGNOSIS — K7040 Alcoholic hepatic failure without coma: Secondary | ICD-10-CM | POA: Diagnosis present

## 2017-06-25 DIAGNOSIS — F10239 Alcohol dependence with withdrawal, unspecified: Secondary | ICD-10-CM | POA: Diagnosis present

## 2017-06-25 DIAGNOSIS — D696 Thrombocytopenia, unspecified: Secondary | ICD-10-CM | POA: Diagnosis present

## 2017-06-25 DIAGNOSIS — Z96641 Presence of right artificial hip joint: Secondary | ICD-10-CM | POA: Diagnosis present

## 2017-06-25 DIAGNOSIS — Z809 Family history of malignant neoplasm, unspecified: Secondary | ICD-10-CM

## 2017-06-25 DIAGNOSIS — N390 Urinary tract infection, site not specified: Secondary | ICD-10-CM | POA: Diagnosis present

## 2017-06-25 DIAGNOSIS — K922 Gastrointestinal hemorrhage, unspecified: Secondary | ICD-10-CM | POA: Diagnosis not present

## 2017-06-25 DIAGNOSIS — M79A22 Nontraumatic compartment syndrome of left lower extremity: Secondary | ICD-10-CM | POA: Diagnosis present

## 2017-06-25 DIAGNOSIS — J9600 Acute respiratory failure, unspecified whether with hypoxia or hypercapnia: Secondary | ICD-10-CM

## 2017-06-25 DIAGNOSIS — K7460 Unspecified cirrhosis of liver: Secondary | ICD-10-CM | POA: Diagnosis not present

## 2017-06-25 DIAGNOSIS — K7031 Alcoholic cirrhosis of liver with ascites: Secondary | ICD-10-CM | POA: Diagnosis present

## 2017-06-25 DIAGNOSIS — J9601 Acute respiratory failure with hypoxia: Secondary | ICD-10-CM

## 2017-06-25 DIAGNOSIS — I700 Atherosclerosis of aorta: Secondary | ICD-10-CM | POA: Diagnosis present

## 2017-06-25 DIAGNOSIS — J80 Acute respiratory distress syndrome: Secondary | ICD-10-CM | POA: Diagnosis not present

## 2017-06-25 DIAGNOSIS — R7989 Other specified abnormal findings of blood chemistry: Secondary | ICD-10-CM

## 2017-06-25 DIAGNOSIS — N170 Acute kidney failure with tubular necrosis: Secondary | ICD-10-CM

## 2017-06-25 DIAGNOSIS — Z419 Encounter for procedure for purposes other than remedying health state, unspecified: Secondary | ICD-10-CM

## 2017-06-25 DIAGNOSIS — R198 Other specified symptoms and signs involving the digestive system and abdomen: Secondary | ICD-10-CM

## 2017-06-25 DIAGNOSIS — Z452 Encounter for adjustment and management of vascular access device: Secondary | ICD-10-CM

## 2017-06-25 DIAGNOSIS — Z515 Encounter for palliative care: Secondary | ICD-10-CM | POA: Diagnosis not present

## 2017-06-25 DIAGNOSIS — I214 Non-ST elevation (NSTEMI) myocardial infarction: Secondary | ICD-10-CM | POA: Diagnosis not present

## 2017-06-25 DIAGNOSIS — F17210 Nicotine dependence, cigarettes, uncomplicated: Secondary | ICD-10-CM | POA: Diagnosis present

## 2017-06-25 DIAGNOSIS — B958 Unspecified staphylococcus as the cause of diseases classified elsewhere: Secondary | ICD-10-CM | POA: Diagnosis present

## 2017-06-25 DIAGNOSIS — M549 Dorsalgia, unspecified: Secondary | ICD-10-CM

## 2017-06-25 DIAGNOSIS — K921 Melena: Secondary | ICD-10-CM

## 2017-06-25 DIAGNOSIS — R918 Other nonspecific abnormal finding of lung field: Secondary | ICD-10-CM | POA: Diagnosis not present

## 2017-06-25 DIAGNOSIS — E861 Hypovolemia: Secondary | ICD-10-CM | POA: Diagnosis present

## 2017-06-25 DIAGNOSIS — M6282 Rhabdomyolysis: Secondary | ICD-10-CM | POA: Diagnosis present

## 2017-06-25 DIAGNOSIS — I8511 Secondary esophageal varices with bleeding: Secondary | ICD-10-CM | POA: Diagnosis present

## 2017-06-25 DIAGNOSIS — J9690 Respiratory failure, unspecified, unspecified whether with hypoxia or hypercapnia: Secondary | ICD-10-CM

## 2017-06-25 DIAGNOSIS — Z885 Allergy status to narcotic agent status: Secondary | ICD-10-CM | POA: Diagnosis not present

## 2017-06-25 DIAGNOSIS — I330 Acute and subacute infective endocarditis: Secondary | ICD-10-CM | POA: Diagnosis present

## 2017-06-25 DIAGNOSIS — N179 Acute kidney failure, unspecified: Secondary | ICD-10-CM | POA: Diagnosis present

## 2017-06-25 DIAGNOSIS — R6521 Severe sepsis with septic shock: Secondary | ICD-10-CM | POA: Diagnosis present

## 2017-06-25 DIAGNOSIS — K8020 Calculus of gallbladder without cholecystitis without obstruction: Secondary | ICD-10-CM | POA: Diagnosis present

## 2017-06-25 DIAGNOSIS — M5416 Radiculopathy, lumbar region: Secondary | ICD-10-CM | POA: Diagnosis present

## 2017-06-25 DIAGNOSIS — F10939 Alcohol use, unspecified with withdrawal, unspecified: Secondary | ICD-10-CM

## 2017-06-25 DIAGNOSIS — E872 Acidosis: Secondary | ICD-10-CM | POA: Diagnosis present

## 2017-06-25 DIAGNOSIS — R161 Splenomegaly, not elsewhere classified: Secondary | ICD-10-CM | POA: Diagnosis present

## 2017-06-25 DIAGNOSIS — I749 Embolism and thrombosis of unspecified artery: Secondary | ICD-10-CM | POA: Diagnosis not present

## 2017-06-25 DIAGNOSIS — R778 Other specified abnormalities of plasma proteins: Secondary | ICD-10-CM

## 2017-06-25 DIAGNOSIS — R739 Hyperglycemia, unspecified: Secondary | ICD-10-CM | POA: Diagnosis not present

## 2017-06-25 DIAGNOSIS — I864 Gastric varices: Secondary | ICD-10-CM | POA: Diagnosis present

## 2017-06-25 HISTORY — DX: Other chronic pain: G89.29

## 2017-06-25 HISTORY — DX: Dorsalgia, unspecified: M54.9

## 2017-06-25 HISTORY — DX: Tobacco use: Z72.0

## 2017-06-25 HISTORY — PX: THROMBECTOMY FEMORAL ARTERY: SHX6406

## 2017-06-25 HISTORY — DX: Unspecified jaundice: R17

## 2017-06-25 HISTORY — DX: Radiculopathy, lumbar region: M54.16

## 2017-06-25 LAB — RAPID URINE DRUG SCREEN, HOSP PERFORMED
Amphetamines: NOT DETECTED
BENZODIAZEPINES: POSITIVE — AB
Barbiturates: NOT DETECTED
COCAINE: NOT DETECTED
Opiates: POSITIVE — AB
Tetrahydrocannabinol: NOT DETECTED

## 2017-06-25 LAB — CBC WITH DIFFERENTIAL/PLATELET
BASOS PCT: 0 %
Basophils Absolute: 0 10*3/uL (ref 0.0–0.1)
Basophils Absolute: 0 10*3/uL (ref 0.0–0.1)
Basophils Relative: 0 %
EOS ABS: 0 10*3/uL (ref 0.0–0.7)
EOS PCT: 0 %
EOS PCT: 0 %
Eosinophils Absolute: 0 10*3/uL (ref 0.0–0.7)
HCT: 25.9 % — ABNORMAL LOW (ref 36.0–46.0)
HEMATOCRIT: 30.1 % — AB (ref 36.0–46.0)
Hemoglobin: 9.9 g/dL — ABNORMAL LOW (ref 12.0–15.0)
Hemoglobin: 8.5 g/dL — ABNORMAL LOW (ref 12.0–15.0)
LYMPHS ABS: 0.5 10*3/uL — AB (ref 0.7–4.0)
LYMPHS ABS: 1.4 10*3/uL (ref 0.7–4.0)
Lymphocytes Relative: 2 %
Lymphocytes Relative: 6 %
MCH: 31.5 pg (ref 26.0–34.0)
MCH: 32 pg (ref 26.0–34.0)
MCHC: 32.9 g/dL (ref 30.0–36.0)
MCHC: 32.8 g/dL (ref 30.0–36.0)
MCV: 95.9 fL (ref 78.0–100.0)
MCV: 97.4 fL (ref 78.0–100.0)
MONOS PCT: 14 %
MONOS PCT: 8 %
Monocytes Absolute: 3.5 10*3/uL — ABNORMAL HIGH (ref 0.1–1.0)
Monocytes Absolute: 1.8 10*3/uL — ABNORMAL HIGH (ref 0.1–1.0)
NEUTROS ABS: 21.1 10*3/uL — AB (ref 1.7–7.7)
Neutro Abs: 18.7 10*3/uL — ABNORMAL HIGH (ref 1.7–7.7)
Neutrophils Relative %: 84 %
Neutrophils Relative %: 86 %
PLATELETS: 67 10*3/uL — AB (ref 150–400)
PLATELETS: 53 10*3/uL — AB (ref 150–400)
RBC: 3.14 MIL/uL — AB (ref 3.87–5.11)
RBC: 2.66 MIL/uL — ABNORMAL LOW (ref 3.87–5.11)
RDW: 19.1 % — AB (ref 11.5–15.5)
RDW: 19.5 % — AB (ref 11.5–15.5)
WBC: 25.1 10*3/uL — AB (ref 4.0–10.5)
WBC: 22 10*3/uL — AB (ref 4.0–10.5)

## 2017-06-25 LAB — TROPONIN I
Troponin I: 0.29 ng/mL (ref <0.03)
Troponin I: 0.35 ng/mL (ref <0.03)
Troponin I: 0.98 ng/mL (ref <0.03)

## 2017-06-25 LAB — BASIC METABOLIC PANEL
Anion gap: 10 (ref 5–15)
BUN: 23 mg/dL — AB (ref 6–20)
CO2: 18 mmol/L — ABNORMAL LOW (ref 22–32)
CREATININE: 1.2 mg/dL — AB (ref 0.44–1.00)
Calcium: 6.8 mg/dL — ABNORMAL LOW (ref 8.9–10.3)
Chloride: 105 mmol/L (ref 101–111)
GFR calc Af Amer: 60 mL/min — ABNORMAL LOW (ref >60)
GFR, EST NON AFRICAN AMERICAN: 51 mL/min — AB (ref >60)
Glucose, Bld: 93 mg/dL (ref 65–99)
Potassium: 5.1 mmol/L (ref 3.5–5.1)
Sodium: 133 mmol/L — ABNORMAL LOW (ref 135–145)

## 2017-06-25 LAB — POCT I-STAT, CHEM 8
BUN: 22 mg/dL — ABNORMAL HIGH (ref 6–20)
CREATININE: 1.1 mg/dL — AB (ref 0.44–1.00)
Calcium, Ion: 0.98 mmol/L — ABNORMAL LOW (ref 1.15–1.40)
Chloride: 102 mmol/L (ref 101–111)
GLUCOSE: 94 mg/dL (ref 65–99)
HCT: 26 % — ABNORMAL LOW (ref 36.0–46.0)
Hemoglobin: 8.8 g/dL — ABNORMAL LOW (ref 12.0–15.0)
POTASSIUM: 5 mmol/L (ref 3.5–5.1)
Sodium: 137 mmol/L (ref 135–145)
TCO2: 21 mmol/L — ABNORMAL LOW (ref 22–32)

## 2017-06-25 LAB — URINALYSIS, ROUTINE W REFLEX MICROSCOPIC
Glucose, UA: NEGATIVE mg/dL
KETONES UR: NEGATIVE mg/dL
Leukocytes, UA: NEGATIVE
Nitrite: NEGATIVE
PROTEIN: 100 mg/dL — AB
Specific Gravity, Urine: 1.02 (ref 1.005–1.030)
pH: 5 (ref 5.0–8.0)

## 2017-06-25 LAB — TYPE AND SCREEN
ABO/RH(D): O POS
ANTIBODY SCREEN: POSITIVE
PT AG TYPE: NEGATIVE

## 2017-06-25 LAB — BLOOD GAS, ARTERIAL
Bicarbonate: 22.2 mmol/L (ref 20.0–28.0)
Drawn by: 257881
O2 Content: 4 L/min
O2 Saturation: 93.6 %
PH ART: 7.431 (ref 7.350–7.450)
pCO2 arterial: 31.7 mmHg — ABNORMAL LOW (ref 32.0–48.0)
pO2, Arterial: 75.1 mmHg — ABNORMAL LOW (ref 83.0–108.0)

## 2017-06-25 LAB — COMPREHENSIVE METABOLIC PANEL
ALK PHOS: 168 U/L — AB (ref 38–126)
ALT: 31 U/L (ref 14–54)
AST: 76 U/L — ABNORMAL HIGH (ref 15–41)
Albumin: 1.9 g/dL — ABNORMAL LOW (ref 3.5–5.0)
Anion gap: 16 — ABNORMAL HIGH (ref 5–15)
BUN: 22 mg/dL — AB (ref 6–20)
CO2: 17 mmol/L — AB (ref 22–32)
CREATININE: 0.84 mg/dL (ref 0.44–1.00)
Calcium: 7.9 mg/dL — ABNORMAL LOW (ref 8.9–10.3)
Chloride: 97 mmol/L — ABNORMAL LOW (ref 101–111)
GFR calc non Af Amer: >60 mL/min (ref >60)
Glucose, Bld: 101 mg/dL — ABNORMAL HIGH (ref 65–99)
Potassium: 4.3 mmol/L (ref 3.5–5.1)
SODIUM: 130 mmol/L — AB (ref 135–145)
Total Bilirubin: 11.1 mg/dL — ABNORMAL HIGH (ref 0.3–1.2)
Total Protein: 6 g/dL — ABNORMAL LOW (ref 6.5–8.1)

## 2017-06-25 LAB — I-STAT CHEM 8, ED
BUN: 25 mg/dL — ABNORMAL HIGH (ref 6–20)
CHLORIDE: 102 mmol/L (ref 101–111)
Calcium, Ion: 0.8 mmol/L — CL (ref 1.15–1.40)
Creatinine, Ser: 0.9 mg/dL (ref 0.44–1.00)
Glucose, Bld: 93 mg/dL (ref 65–99)
HEMATOCRIT: 30 % — AB (ref 36.0–46.0)
Hemoglobin: 10.2 g/dL — ABNORMAL LOW (ref 12.0–15.0)
Potassium: 4.8 mmol/L (ref 3.5–5.1)
Sodium: 132 mmol/L — ABNORMAL LOW (ref 135–145)
TCO2: 21 mmol/L — ABNORMAL LOW (ref 22–32)

## 2017-06-25 LAB — BRAIN NATRIURETIC PEPTIDE: B Natriuretic Peptide: 677 pg/mL — ABNORMAL HIGH (ref 0.0–100.0)

## 2017-06-25 LAB — GLUCOSE, CAPILLARY: GLUCOSE-CAPILLARY: 95 mg/dL (ref 65–99)

## 2017-06-25 LAB — MAGNESIUM
MAGNESIUM: 1.5 mg/dL — AB (ref 1.7–2.4)
Magnesium: 1 mg/dL — ABNORMAL LOW (ref 1.7–2.4)

## 2017-06-25 LAB — PROTIME-INR
INR: 1.96
Prothrombin Time: 22.1 seconds — ABNORMAL HIGH (ref 11.4–15.2)

## 2017-06-25 LAB — ETHANOL: Alcohol, Ethyl (B): <10 mg/dL (ref <10)

## 2017-06-25 LAB — CK: CK TOTAL: 1062 U/L — AB (ref 38–234)

## 2017-06-25 LAB — AMMONIA: Ammonia: 33 umol/L (ref 9–35)

## 2017-06-25 LAB — PROCALCITONIN: Procalcitonin: 1.33 ng/mL

## 2017-06-25 LAB — LIPASE, BLOOD: LIPASE: 39 U/L (ref 11–51)

## 2017-06-25 LAB — MRSA PCR SCREENING: MRSA BY PCR: NEGATIVE

## 2017-06-25 LAB — LACTIC ACID, PLASMA: Lactic Acid, Venous: 3.1 mmol/L (ref 0.5–1.9)

## 2017-06-25 LAB — PHOSPHORUS: Phosphorus: 5.9 mg/dL — ABNORMAL HIGH (ref 2.5–4.6)

## 2017-06-25 SURGERY — THROMBECTOMY, ARTERY, FEMORAL
Anesthesia: General | Site: Leg Upper | Laterality: Left

## 2017-06-25 MED ORDER — FOLIC ACID 5 MG/ML IJ SOLN
1.0000 mg | Freq: Every day | INTRAMUSCULAR | Status: DC
  Administered 2017-06-26 – 2017-06-29 (×4): 1 mg via INTRAVENOUS
  Filled 2017-06-25 (×4): qty 0.2

## 2017-06-25 MED ORDER — 0.9 % SODIUM CHLORIDE (POUR BTL) OPTIME
TOPICAL | Status: DC | PRN
  Administered 2017-06-25: 2000 mL

## 2017-06-25 MED ORDER — ARTIFICIAL TEARS OPHTHALMIC OINT
TOPICAL_OINTMENT | OPHTHALMIC | Status: DC | PRN
  Administered 2017-06-25: 1 via OPHTHALMIC

## 2017-06-25 MED ORDER — KETAMINE HCL 10 MG/ML IJ SOLN
INTRAMUSCULAR | Status: AC
  Administered 2017-06-25: 70 mg
  Filled 2017-06-25: qty 1

## 2017-06-25 MED ORDER — FENTANYL CITRATE (PF) 100 MCG/2ML IJ SOLN
50.0000 ug | Freq: Once | INTRAMUSCULAR | Status: AC
  Administered 2017-06-25: 50 ug via INTRAVENOUS
  Filled 2017-06-25: qty 2

## 2017-06-25 MED ORDER — SODIUM CHLORIDE 0.9 % IV SOLN
50.0000 ug/h | INTRAVENOUS | Status: DC
  Administered 2017-06-25 – 2017-06-30 (×10): 50 ug/h via INTRAVENOUS
  Filled 2017-06-25 (×17): qty 1

## 2017-06-25 MED ORDER — LACTATED RINGERS IV SOLN: INTRAVENOUS | Status: DC | PRN

## 2017-06-25 MED ORDER — MAGNESIUM SULFATE 2 GM/50ML IV SOLN
2.0000 g | Freq: Once | INTRAVENOUS | Status: AC
  Administered 2017-06-25: 2 g via INTRAVENOUS
  Filled 2017-06-25: qty 50

## 2017-06-25 MED ORDER — SODIUM CHLORIDE 0.9 % IV SOLN
2.0000 mg/h | INTRAVENOUS | Status: DC
  Administered 2017-06-25: 2 mg/h via INTRAVENOUS
  Filled 2017-06-25: qty 10

## 2017-06-25 MED ORDER — ROCURONIUM BROMIDE 100 MG/10ML IV SOLN
INTRAVENOUS | Status: DC | PRN
  Administered 2017-06-25: 30 mg via INTRAVENOUS
  Administered 2017-06-25: 10 mg via INTRAVENOUS

## 2017-06-25 MED ORDER — SODIUM CHLORIDE 0.9 % IV BOLUS (SEPSIS)
500.0000 mL | Freq: Once | INTRAVENOUS | Status: AC
  Administered 2017-06-25: 500 mL via INTRAVENOUS

## 2017-06-25 MED ORDER — ONDANSETRON HCL 4 MG/2ML IJ SOLN
4.0000 mg | Freq: Four times a day (QID) | INTRAMUSCULAR | Status: DC | PRN
  Administered 2017-06-25: 4 mg via INTRAVENOUS
  Filled 2017-06-25: qty 2

## 2017-06-25 MED ORDER — LORAZEPAM 1 MG PO TABS: 0.0000 mg | ORAL_TABLET | Freq: Two times a day (BID) | ORAL | Status: DC

## 2017-06-25 MED ORDER — FUROSEMIDE 10 MG/ML IJ SOLN: 20.0000 mg | Freq: Once | INTRAMUSCULAR | Status: DC

## 2017-06-25 MED ORDER — SODIUM CHLORIDE 0.9 % IV SOLN: Freq: Once | INTRAVENOUS | Status: DC

## 2017-06-25 MED ORDER — LORAZEPAM 2 MG/ML IJ SOLN
INTRAMUSCULAR | Status: AC
  Filled 2017-06-25: qty 1

## 2017-06-25 MED ORDER — MAGNESIUM SULFATE 2 GM/50ML IV SOLN
2.0000 g | Freq: Once | INTRAVENOUS | Status: AC
  Administered 2017-06-26: 2 g via INTRAVENOUS
  Filled 2017-06-25: qty 50

## 2017-06-25 MED ORDER — FENTANYL BOLUS VIA INFUSION
50.0000 ug | INTRAVENOUS | Status: DC | PRN
  Administered 2017-06-25 – 2017-06-27 (×2): 50 ug via INTRAVENOUS
  Filled 2017-06-25: qty 50

## 2017-06-25 MED ORDER — LORAZEPAM 1 MG PO TABS: 0.0000 mg | ORAL_TABLET | Freq: Four times a day (QID) | ORAL | Status: DC

## 2017-06-25 MED ORDER — IPRATROPIUM-ALBUTEROL 0.5-2.5 (3) MG/3ML IN SOLN
3.0000 mL | Freq: Once | RESPIRATORY_TRACT | Status: AC
  Administered 2017-06-25: 3 mL via RESPIRATORY_TRACT
  Filled 2017-06-25: qty 3

## 2017-06-25 MED ORDER — SODIUM CHLORIDE 0.9 % IV BOLUS (SEPSIS)
1000.0000 mL | Freq: Once | INTRAVENOUS | Status: AC
  Administered 2017-06-25: 1000 mL via INTRAVENOUS

## 2017-06-25 MED ORDER — SODIUM CHLORIDE 0.9 % IV SOLN
8.0000 mg/h | INTRAVENOUS | Status: AC
  Administered 2017-06-25 – 2017-06-28 (×6): 8 mg/h via INTRAVENOUS
  Filled 2017-06-25 (×12): qty 80

## 2017-06-25 MED ORDER — SODIUM CHLORIDE 0.9 % IV SOLN
0.0000 ug/min | INTRAVENOUS | Status: DC
  Administered 2017-06-25: 40 ug/min via INTRAVENOUS
  Filled 2017-06-25: qty 1

## 2017-06-25 MED ORDER — SODIUM CHLORIDE 0.9 % IV SOLN
INTRAVENOUS | Status: DC
  Administered 2017-06-25: 16:00:00 via INTRAVENOUS

## 2017-06-25 MED ORDER — LORAZEPAM 2 MG/ML IJ SOLN: 0.0000 mg | Freq: Two times a day (BID) | INTRAMUSCULAR | Status: DC

## 2017-06-25 MED ORDER — VITAMIN B-1 100 MG PO TABS
100.0000 mg | ORAL_TABLET | Freq: Every day | ORAL | Status: DC
  Filled 2017-06-25 (×4): qty 1

## 2017-06-25 MED ORDER — ONDANSETRON HCL 4 MG/2ML IJ SOLN: 4.0000 mg | Freq: Once | INTRAMUSCULAR | Status: DC

## 2017-06-25 MED ORDER — SODIUM CHLORIDE 0.9 % IV SOLN
1.0000 g | INTRAVENOUS | Status: DC
  Administered 2017-06-25: 1 g via INTRAVENOUS
  Filled 2017-06-25: qty 10

## 2017-06-25 MED ORDER — PHENYLEPHRINE 40 MCG/ML (10ML) SYRINGE FOR IV PUSH (FOR BLOOD PRESSURE SUPPORT)
200.0000 ug | PREFILLED_SYRINGE | Freq: Once | INTRAVENOUS | Status: DC | PRN
  Filled 2017-06-25: qty 5

## 2017-06-25 MED ORDER — LORAZEPAM 2 MG/ML IJ SOLN
2.0000 mg | Freq: Once | INTRAMUSCULAR | Status: AC
  Administered 2017-06-25: 2 mg via INTRAVENOUS
  Filled 2017-06-25: qty 1

## 2017-06-25 MED ORDER — SODIUM CHLORIDE 0.9 % IV SOLN
250.0000 mL | INTRAVENOUS | Status: DC | PRN
  Administered 2017-06-25: 23:00:00 via INTRAVENOUS

## 2017-06-25 MED ORDER — MIDAZOLAM HCL 2 MG/2ML IJ SOLN
INTRAMUSCULAR | Status: AC
  Filled 2017-06-25: qty 2

## 2017-06-25 MED ORDER — FENTANYL CITRATE (PF) 100 MCG/2ML IJ SOLN: 50.0000 ug | Freq: Once | INTRAMUSCULAR | Status: DC

## 2017-06-25 MED ORDER — SODIUM CHLORIDE 0.9 % IV SOLN
80.0000 mg | Freq: Once | INTRAVENOUS | Status: AC
  Administered 2017-06-25: 80 mg via INTRAVENOUS
  Filled 2017-06-25: qty 80

## 2017-06-25 MED ORDER — SODIUM CHLORIDE 0.9 % IV SOLN
0.0000 ug/kg/h | INTRAVENOUS | Status: DC
  Administered 2017-06-25: 0.5 ug/kg/h via INTRAVENOUS
  Filled 2017-06-25 (×2): qty 2

## 2017-06-25 MED ORDER — "THROMBI-PAD 3""X3"" EX PADS"
1.0000 | MEDICATED_PAD | Freq: Once | CUTANEOUS | Status: AC
  Administered 2017-06-25: 1 via TOPICAL
  Filled 2017-06-25: qty 1

## 2017-06-25 MED ORDER — LORAZEPAM 2 MG/ML IJ SOLN
2.0000 mg | Freq: Once | INTRAMUSCULAR | Status: AC
Start: 2017-06-25 — End: 2017-06-25
  Administered 2017-06-25: 2 mg via INTRAVENOUS

## 2017-06-25 MED ORDER — LORAZEPAM 2 MG/ML IJ SOLN
2.0000 mg | Freq: Once | INTRAMUSCULAR | Status: AC
  Administered 2017-06-25: 2 mg via INTRAVENOUS

## 2017-06-25 MED ORDER — SUCCINYLCHOLINE CHLORIDE 20 MG/ML IJ SOLN
INTRAMUSCULAR | Status: AC | PRN
  Administered 2017-06-25: 100 mg via INTRAVENOUS

## 2017-06-25 MED ORDER — SODIUM CHLORIDE 0.9 % IV SOLN
INTRAVENOUS | Status: DC
Start: 2017-06-25 — End: 2017-06-25
  Administered 2017-06-25: 14:00:00 via INTRAVENOUS

## 2017-06-25 MED ORDER — VASOPRESSIN 20 UNIT/ML IV SOLN
0.0300 [IU]/min | INTRAVENOUS | Status: DC
  Administered 2017-06-25 – 2017-06-29 (×5): 0.03 [IU]/min via INTRAVENOUS
  Filled 2017-06-25 (×5): qty 2

## 2017-06-25 MED ORDER — MIDAZOLAM HCL 50 MG/10ML IJ SOLN
INTRAMUSCULAR | Status: AC
  Filled 2017-06-25: qty 1

## 2017-06-25 MED ORDER — SODIUM CHLORIDE 0.9 % IV SOLN
0.0000 ug/min | INTRAVENOUS | Status: DC
  Administered 2017-06-25: 200 ug/min via INTRAVENOUS
  Administered 2017-06-26: 140 ug/min via INTRAVENOUS
  Administered 2017-06-26: 60 ug/min via INTRAVENOUS
  Administered 2017-06-26: 130 ug/min via INTRAVENOUS
  Administered 2017-06-27: 310 ug/min via INTRAVENOUS
  Administered 2017-06-27: 230 ug/min via INTRAVENOUS
  Filled 2017-06-25 (×2): qty 4
  Filled 2017-06-25 (×3): qty 40
  Filled 2017-06-25: qty 4
  Filled 2017-06-25: qty 40

## 2017-06-25 MED ORDER — IPRATROPIUM-ALBUTEROL 0.5-2.5 (3) MG/3ML IN SOLN
3.0000 mL | Freq: Once | RESPIRATORY_TRACT | Status: AC
Start: 2017-06-25 — End: 2017-06-25
  Administered 2017-06-25: 3 mL via RESPIRATORY_TRACT
  Filled 2017-06-25: qty 3

## 2017-06-25 MED ORDER — FENTANYL 2500MCG IN NS 250ML (10MCG/ML) PREMIX INFUSION
25.0000 ug/h | INTRAVENOUS | Status: DC
  Administered 2017-06-25: 50 ug/h via INTRAVENOUS
  Administered 2017-06-26: 150 ug/h via INTRAVENOUS
  Administered 2017-06-26 (×2): 50 ug/h via INTRAVENOUS
  Administered 2017-06-27: 200 ug/h via INTRAVENOUS
  Administered 2017-06-29: 50 ug/h via INTRAVENOUS
  Filled 2017-06-25 (×4): qty 250

## 2017-06-25 MED ORDER — KETAMINE HCL 10 MG/ML IJ SOLN
INTRAMUSCULAR | Status: AC | PRN
  Administered 2017-06-25: 70 mg via INTRAVENOUS

## 2017-06-25 MED ORDER — KETAMINE HCL 10 MG/ML IJ SOLN: 1.0000 mg/kg | Freq: Once | INTRAMUSCULAR | Status: DC

## 2017-06-25 MED ORDER — SODIUM CHLORIDE 0.9 % IV SOLN
INTRAVENOUS | Status: DC
  Administered 2017-06-26 (×2): via INTRAVENOUS

## 2017-06-25 MED ORDER — THIAMINE HCL 100 MG/ML IJ SOLN
100.0000 mg | Freq: Every day | INTRAMUSCULAR | Status: DC
  Administered 2017-06-25 – 2017-06-29 (×5): 100 mg via INTRAVENOUS
  Filled 2017-06-25 (×2): qty 1
  Filled 2017-06-25: qty 2
  Filled 2017-06-25 (×2): qty 1
  Filled 2017-06-25: qty 2

## 2017-06-25 MED ORDER — LORAZEPAM 2 MG/ML IJ SOLN
0.0000 mg | Freq: Four times a day (QID) | INTRAMUSCULAR | Status: DC
  Administered 2017-06-25: 2 mg via INTRAVENOUS
  Filled 2017-06-25 (×2): qty 1

## 2017-06-25 MED ORDER — MIDAZOLAM HCL 5 MG/5ML IJ SOLN
INTRAMUSCULAR | Status: DC | PRN
  Administered 2017-06-25 – 2017-06-26 (×4): 1 mg via INTRAVENOUS

## 2017-06-25 SURGICAL SUPPLY — 52 items
BANDAGE ESMARK 6X9 LF (GAUZE/BANDAGES/DRESSINGS) IMPLANT
BNDG ESMARK 6X9 LF (GAUZE/BANDAGES/DRESSINGS)
CANISTER SUCT 3000ML PPV (MISCELLANEOUS) ×3 IMPLANT
CANISTER WOUND CARE 500ML ATS (WOUND CARE) ×3 IMPLANT
CANNULA VESSEL 3MM 2 BLNT TIP (CANNULA) ×3 IMPLANT
CATH EMB 4FR 80CM (CATHETERS) ×3 IMPLANT
CLIP LIGATING EXTRA MED SLVR (CLIP) ×3 IMPLANT
CLIP LIGATING EXTRA SM BLUE (MISCELLANEOUS) ×3 IMPLANT
CONNECTOR Y ATS VAC SYSTEM (MISCELLANEOUS) ×3 IMPLANT
CONT SPECI 4OZ STER CLIK (MISCELLANEOUS) ×3 IMPLANT
CUFF TOURNIQUET SINGLE 34IN LL (TOURNIQUET CUFF) IMPLANT
CUFF TOURNIQUET SINGLE 44IN (TOURNIQUET CUFF) IMPLANT
DERMABOND ADVANCED (GAUZE/BANDAGES/DRESSINGS) ×2
DERMABOND ADVANCED .7 DNX12 (GAUZE/BANDAGES/DRESSINGS) ×1 IMPLANT
DRAIN SNY 10X20 3/4 PERF (WOUND CARE) IMPLANT
DRAPE X-RAY CASS 24X20 (DRAPES) IMPLANT
DRSG VAC ATS MED SENSATRAC (GAUZE/BANDAGES/DRESSINGS) ×6 IMPLANT
ELECT REM PT RETURN 9FT ADLT (ELECTROSURGICAL) ×3
ELECTRODE REM PT RTRN 9FT ADLT (ELECTROSURGICAL) ×1 IMPLANT
EVACUATOR SILICONE 100CC (DRAIN) IMPLANT
GAUZE SPONGE 4X4 12PLY STRL (GAUZE/BANDAGES/DRESSINGS) IMPLANT
GLOVE BIOGEL PI IND STRL 7.5 (GLOVE) ×1 IMPLANT
GLOVE BIOGEL PI INDICATOR 7.5 (GLOVE) ×2
GLOVE ECLIPSE 7.0 STRL STRAW (GLOVE) ×3 IMPLANT
GLOVE INDICATOR 7.0 STRL GRN (GLOVE) ×3 IMPLANT
GLOVE SS BIOGEL STRL SZ 7.5 (GLOVE) ×1 IMPLANT
GLOVE SUPERSENSE BIOGEL SZ 7.5 (GLOVE) ×2
GLOVE SURG SS PI 7.5 STRL IVOR (GLOVE) ×3 IMPLANT
GOWN STRL REUS W/ TWL LRG LVL3 (GOWN DISPOSABLE) ×5 IMPLANT
GOWN STRL REUS W/TWL LRG LVL3 (GOWN DISPOSABLE) ×10
INSERT FOGARTY SM (MISCELLANEOUS) IMPLANT
KIT BASIN OR (CUSTOM PROCEDURE TRAY) ×3 IMPLANT
KIT ROOM TURNOVER OR (KITS) ×3 IMPLANT
NS IRRIG 1000ML POUR BTL (IV SOLUTION) ×6 IMPLANT
PACK PERIPHERAL VASCULAR (CUSTOM PROCEDURE TRAY) ×3 IMPLANT
PAD ARMBOARD 7.5X6 YLW CONV (MISCELLANEOUS) ×6 IMPLANT
PADDING CAST COTTON 6X4 STRL (CAST SUPPLIES) IMPLANT
SET COLLECT BLD 21X3/4 12 (NEEDLE) IMPLANT
STAPLER VISISTAT 35W (STAPLE) IMPLANT
STOPCOCK 4 WAY LG BORE MALE ST (IV SETS) IMPLANT
SUT ETHILON 3 0 PS 1 (SUTURE) IMPLANT
SUT PROLENE 5 0 C 1 24 (SUTURE) ×3 IMPLANT
SUT PROLENE 6 0 CC (SUTURE) ×3 IMPLANT
SUT SILK 2 0 SH (SUTURE) IMPLANT
SUT VIC AB 2-0 CTX 36 (SUTURE) ×3 IMPLANT
SUT VIC AB 3-0 SH 27 (SUTURE) ×2
SUT VIC AB 3-0 SH 27X BRD (SUTURE) ×1 IMPLANT
TOWEL GREEN STERILE (TOWEL DISPOSABLE) ×3 IMPLANT
TRAY FOLEY MTR SLVR 16FR STAT (CATHETERS) IMPLANT
TUBING EXTENTION W/L.L. (IV SETS) IMPLANT
UNDERPAD 30X30 (UNDERPADS AND DIAPERS) ×3 IMPLANT
WATER STERILE IRR 1000ML POUR (IV SOLUTION) ×3 IMPLANT

## 2017-06-25 NOTE — H&P (Addendum)
PULMONARY / CRITICAL CARE MEDICINE   Name: Whitney Mathis MRN: [id number] DOB: 04/13/1966    ADMISSION DATE:  07/09/2017 CONSULTATION DATE:  06/22/2017  REFERRING MD:  Hyacinth Meeker   CHIEF COMPLAINT:  Left Leg Ischemia   HISTORY OF PRESENT ILLNESS:   52 year old female with PMH of ETOH, Liver Cirrhosis, Tobacco use  Presents to [facility] 3/10 with left lung numbness that began earlier this morning. Presents to ED with dyspnea, tachycardia, and melena. WBC 25.1. Found to have no pulses below the knee of left side. Vascular consulted. While in the ED patient went into respiratory distress requiring intubation Transferred to [facility] for further work-up.   Upon arrival to [facility] patient HR 150-160, Systolic 90-100. Vascular at bedside with plans to take patient emergently to OR. While in unit patient became hypotensive requiring central line placement and initiation of neo and vasopressin gtt.    PAST MEDICAL HISTORY :  She  has a past medical history of Alcohol dependence (HCC), Chronic back pain, Chronic pain, Cirrhosis (HCC), Jaundice, Lumbar radiculopathy, and Tobacco use.  PAST SURGICAL HISTORY: She  has a past surgical history that includes Joint replacement (Right); Tonsillectomy; and Leg Surgery.  Allergies  Allergen Reactions  . Codeine Nausea And Vomiting    No current facility-administered medications on file prior to encounter.    Current Outpatient Medications on File Prior to Encounter  Medication Sig  . acetaminophen (TYLENOL) 325 MG tablet Take 650 mg by mouth every 6 (six) hours as needed for moderate pain.  Marland Kitchen azelastine (OPTIVAR) 0.05 % ophthalmic solution Place 1 drop into both eyes 2 (two) times daily.  . diclofenac sodium (VOLTAREN) 1 % GEL Apply 1 application topically at bedtime.  . furosemide (LASIX) 40 MG tablet Take 40 mg by mouth daily.   Marland Kitchen gabapentin (NEURONTIN) 800 MG tablet Take 1 tablet by mouth 3 (three) times daily as needed for pain.  . meloxicam  (MOBIC) 15 MG tablet Take 1 tablet by mouth daily.  . methocarbamol (ROBAXIN) 500 MG tablet Take 2 tablets by mouth at bedtime.  . montelukast (SINGULAIR) 10 MG tablet Take 1 tablet by mouth daily.  Marland Kitchen spironolactone (ALDACTONE) 100 MG tablet Take 1 tablet by mouth daily.    FAMILY HISTORY:  Her indicated that her mother is deceased. She indicated that her father is deceased.   SOCIAL HISTORY: She  reports that she has been smoking cigarettes.  She started smoking about 36 years ago. She has been smoking about 0.25 packs per day. she has never used smokeless tobacco. She reports that she drinks alcohol. She reports that she does not use drugs.  REVIEW OF SYSTEMS:   Unable to review as patient is intubated and sedated   SUBJECTIVE:   VITAL SIGNS: BP (!) 74/56   Pulse (!) 127   Temp 98.7 F (37.1 C) (Oral)   Resp (!) 21   Ht 5\' 4"  (1.626 m)   Wt 81.2 kg (179 lb 0.2 oz)   SpO2 99%   BMI 30.73 kg/m   HEMODYNAMICS:    VENTILATOR SETTINGS: Vent Mode: PRVC FiO2 (%):  [100 %] 100 % Set Rate:  [16 bmp] 16 bmp Vt Set:  [450 mL] 450 mL PEEP:  [5 cmH20] 5 cmH20 Plateau Pressure:  [21 cmH20] 21 cmH20  INTAKE / OUTPUT: I/O last 3 completed shifts: In: 650 [IV Piggyback:650] Out: -   PHYSICAL EXAMINATION: General:  Adult female, on vent  Neuro:  Sedated, awakens to verbal stimulation,  pupils intact  HEENT:  ETT in place Cardiovascular:  Tachy  Lungs:  Clear breath sounds, no wheeze/crackles  Abdomen:  Distended, active bowel sounds  Musculoskeletal:  -edema, left foot cold below knee with no flow  Skin:  Jaundice, intact    LABS:  BMET Recent Labs  Lab 07/04/2017 1203 06/26/2017 1648 07/01/2017 2138  NA 130* 132* 137  K 4.3 4.8 5.0  CL 97* 102 102  CO2 17*  --   --   BUN 22* 25* 22*  CREATININE 0.84 0.90 1.10*  GLUCOSE 101* 93 94    Electrolytes Recent Labs  Lab 06/22/2017 1203  CALCIUM 7.9*  MG 1.0*    CBC Recent Labs  Lab 06/29/2017 1203 07/10/2017 1648  07/15/2017 2103 07/13/2017 2138  WBC 25.1*  --  22.0*  --   HGB 9.9* 10.2* 8.5* 8.8*  HCT 30.1* 30.0* 25.9* 26.0*  PLT 67*  --  PENDING  --     Coag's Recent Labs  Lab 06/18/2017 1203  INR 1.96    Sepsis Markers Recent Labs  Lab 06/19/2017 2103  LATICACIDVEN 3.1*    ABG Recent Labs  Lab 07/02/2017 1643 07/03/2017 2008  PHART 7.431 7.347*  PCO2ART 31.7* 39.0  PO2ART 75.1* 153*    Liver Enzymes Recent Labs  Lab 06/20/2017 1203  AST 76*  ALT 31  ALKPHOS 168*  BILITOT 11.1*  ALBUMIN 1.9*    Cardiac Enzymes Recent Labs  Lab 06/21/2017 1203 07/13/2017 1731  TROPONINI 0.29* 0.35*    Glucose Recent Labs  Lab 06/21/2017 2049  GLUCAP 95    Imaging Ct Abdomen Pelvis Wo Contrast  Result Date: 06/18/2017 CLINICAL DATA:  Low back pain radiating into left leg. Left leg numbness. Back pain for 3 weeks, new leg symptoms. Denies incontinence of stool or urine. History of chronic back pain and hip surgery. EXAM: CT ABDOMEN AND PELVIS WITHOUT CONTRAST CT LUMBAR SPINE WITHOUT CONTRAST TECHNIQUE: Multidetector CT imaging of the abdomen and pelvis was performed following the standard protocol without IV contrast. COMPARISON:  None. FINDINGS: CHEST CT: Lower chest: Masslike consolidation within the lateral aspects of the right lower lobe, abutting the pleura, measuring 4.7 x 1.7 cm, most likely atelectasis. Additional probable atelectasis at the left lung base. Hepatobiliary: Cirrhotic appearing liver. Single gallstone measuring approximately 6 mm. Mild gallbladder wall thickening which is likely related to the adjacent liver disease and/or ascites. No bile duct dilatation. Pancreas: Unremarkable. No pancreatic ductal dilatation or surrounding inflammatory changes. Spleen: Splenomegaly. Adrenals/Urinary Tract: Adrenal glands appear normal. Kidneys are unremarkable without mass, stone or hydronephrosis. No ureteral or bladder calculi identified. Bladder is decompressed. Stomach/Bowel: Bowel is  normal in caliber. No bowel wall thickening or evidence of bowel wall inflammation appreciated, although characterization of some portions of the bowel is limited by patient motion artifact. Vascular/Lymphatic: Aortic atherosclerosis. Large varices about the splenic hilum. Abnormally prominent caliber of the left renal vein, of uncertain chronicity, possibly also related to the presumed portal venous hypertension. No enlarged lymph nodes seen in the abdomen or pelvis. Reproductive: Uterus and bilateral adnexa are unremarkable. Other: Small amount of ascites within the abdomen and pelvis. No abscess collection seen. No free intraperitoneal air. Musculoskeletal: No acute or suspicious osseous finding. Right hip arthroplasty hardware appears appropriately positioned. Ill-defined edema within the subcutaneous soft tissues of the abdomen and pelvis indicating some degree of anasarca. LUMBAR SPINE CT: Mild dextroscoliosis centered at the L3 vertebral body level. Alignment appears otherwise normal. No evidence of acute vertebral body subluxation.  No fracture line or displaced fracture fragment. No acute or suspicious osseous lesion. Mild disc desiccations at the L2-3 and L3-4 levels, with associated mild disc space narrowings. Mild diffuse disc bulge at L5-S1, with probable mild neural foramen encroachment bilaterally. No large disc bulge or protrusion at any level. No significant central canal stenosis at any level. The immediate paravertebral soft tissues are unremarkable. IMPRESSION: 1. Cirrhotic liver. Associated splenomegaly and upper abdominal varices. Associated small amount of ascites in the abdomen and pelvis. 2. Cholelithiasis without evidence of acute cholecystitis. 3. Masslike consolidation within the right lower lung, lateral aspects of the right lower lobe abutting the pleura, measuring 4.7 cm greatest dimension, most likely atelectasis. Recommend follow-up chest CT at some point to ensure resolution. 4. No  acute findings within the lumbar spine. Mild scoliosis. Mild degenerative change within the lumbar spine, as detailed above. No large disc bulge or protrusion seen. 5. Anasarca. 6. Aortic atherosclerosis. Electronically Signed   By: Bary Richard M.D.   On: 07/16/2017 13:28   Dg Chest 2 View  Result Date: 06/28/2017 CLINICAL DATA:  Short of breath. Wheezing. Cirrhosis. Alcohol dependence. EXAM: CHEST - 2 VIEW COMPARISON:  09/08/2016 FINDINGS: Midline trachea. Cardiomegaly, accentuated by AP portable frontal technique. Small right pleural effusion versus pleural thickening, blunting the right costophrenic angle. No pneumothorax. Moderate pulmonary interstitial prominence and indistinctness. No lobar consolidation. IMPRESSION: Cardiomegaly with moderate interstitial edema. Small right pleural effusion versus pleural thickening. Electronically Signed   By: Jeronimo Greaves M.D.   On: 07/15/2017 13:05   Ct L-spine No Charge  Result Date: 07/16/2017 CLINICAL DATA:  Low back pain radiating into left leg. Left leg numbness. Back pain for 3 weeks, new leg symptoms. Denies incontinence of stool or urine. History of chronic back pain and hip surgery. EXAM: CT ABDOMEN AND PELVIS WITHOUT CONTRAST CT LUMBAR SPINE WITHOUT CONTRAST TECHNIQUE: Multidetector CT imaging of the abdomen and pelvis was performed following the standard protocol without IV contrast. COMPARISON:  None. FINDINGS: CHEST CT: Lower chest: Masslike consolidation within the lateral aspects of the right lower lobe, abutting the pleura, measuring 4.7 x 1.7 cm, most likely atelectasis. Additional probable atelectasis at the left lung base. Hepatobiliary: Cirrhotic appearing liver. Single gallstone measuring approximately 6 mm. Mild gallbladder wall thickening which is likely related to the adjacent liver disease and/or ascites. No bile duct dilatation. Pancreas: Unremarkable. No pancreatic ductal dilatation or surrounding inflammatory changes. Spleen:  Splenomegaly. Adrenals/Urinary Tract: Adrenal glands appear normal. Kidneys are unremarkable without mass, stone or hydronephrosis. No ureteral or bladder calculi identified. Bladder is decompressed. Stomach/Bowel: Bowel is normal in caliber. No bowel wall thickening or evidence of bowel wall inflammation appreciated, although characterization of some portions of the bowel is limited by patient motion artifact. Vascular/Lymphatic: Aortic atherosclerosis. Large varices about the splenic hilum. Abnormally prominent caliber of the left renal vein, of uncertain chronicity, possibly also related to the presumed portal venous hypertension. No enlarged lymph nodes seen in the abdomen or pelvis. Reproductive: Uterus and bilateral adnexa are unremarkable. Other: Small amount of ascites within the abdomen and pelvis. No abscess collection seen. No free intraperitoneal air. Musculoskeletal: No acute or suspicious osseous finding. Right hip arthroplasty hardware appears appropriately positioned. Ill-defined edema within the subcutaneous soft tissues of the abdomen and pelvis indicating some degree of anasarca. LUMBAR SPINE CT: Mild dextroscoliosis centered at the L3 vertebral body level. Alignment appears otherwise normal. No evidence of acute vertebral body subluxation. No fracture line or displaced fracture fragment. No acute or  suspicious osseous lesion. Mild disc desiccations at the L2-3 and L3-4 levels, with associated mild disc space narrowings. Mild diffuse disc bulge at L5-S1, with probable mild neural foramen encroachment bilaterally. No large disc bulge or protrusion at any level. No significant central canal stenosis at any level. The immediate paravertebral soft tissues are unremarkable. IMPRESSION: 1. Cirrhotic liver. Associated splenomegaly and upper abdominal varices. Associated small amount of ascites in the abdomen and pelvis. 2. Cholelithiasis without evidence of acute cholecystitis. 3. Masslike consolidation  within the right lower lung, lateral aspects of the right lower lobe abutting the pleura, measuring 4.7 cm greatest dimension, most likely atelectasis. Recommend follow-up chest CT at some point to ensure resolution. 4. No acute findings within the lumbar spine. Mild scoliosis. Mild degenerative change within the lumbar spine, as detailed above. No large disc bulge or protrusion seen. 5. Anasarca. 6. Aortic atherosclerosis. Electronically Signed   By: Bary Richard M.D.   On: 07/17/2017 13:28   Dg Chest Port 1 View  Result Date: 2017/07/17 CLINICAL DATA:  Assess endotracheal tube position. EXAM: PORTABLE CHEST 1 VIEW COMPARISON:  Chest radiograph performed earlier today at 7:12 p.m. FINDINGS: The patient's endotracheal tube is seen ending 2-3 cm above the carina. The lungs are hypoexpanded. Patchy bilateral airspace opacities are improved from the prior study and may reflect mild pulmonary edema or pneumonia. No definite pleural effusion or pneumothorax is seen. The cardiomediastinal silhouette is borderline enlarged. No acute osseous abnormalities are identified. IMPRESSION: 1. Endotracheal tube seen ending 2-3 cm above the carina. 2. Lungs remain hypoexpanded. Patchy bilateral airspace opacities are improved from the recent prior study and may reflect mild pulmonary edema or pneumonia. 3. Borderline cardiomegaly. Electronically Signed   By: Roanna Raider M.D.   On: 07/17/2017 22:12   Dg Chest Portable 1 View  Result Date: 17-Jul-2017 CLINICAL DATA:  Status post intubation. EXAM: PORTABLE CHEST 1 VIEW COMPARISON:  2017-07-17 at 1634 hours FINDINGS: Endotracheal tube tip projects 3.8 cm above the carina. Bilateral airspace lung opacities appear mildly increased when compared to the earlier exam. IMPRESSION: 1. Endotracheal tube tip well positioned 3.8 cm above the carina. 2. Worsened pulmonary edema since the earlier study. Electronically Signed   By: Amie Portland M.D.   On: Jul 17, 2017 19:36   Dg Chest  Portable 1 View  Result Date: 17-Jul-2017 CLINICAL DATA:  Dyspnea and low back pain today. EXAM: PORTABLE CHEST 1 VIEW COMPARISON:  Chest x-ray from earlier same day. FINDINGS: Study is hypoinspiratory limiting comparison to the earlier exam. Heart size and mediastinal contours are grossly stable. Again noted is diffuse bilateral interstitial edema, perhaps slightly progressed compared to the earlier exam. IMPRESSION: Cardiomegaly and bilateral pulmonary edema, perhaps slightly worsened compared to today's earlier exam, although the appearance of worsening may merely be due to the low lung volumes on this chest x-ray. Electronically Signed   By: Bary Richard M.D.   On: 2017/07/17 16:55     STUDIES:  CTA 3/10 > 1. Cirrhotic liver. Associated splenomegaly and upper abdominal varices. Associated small amount of ascites in the abdomen and pelvis. 2. Cholelithiasis without evidence of acute cholecystitis. 3. Masslike consolidation within the right lower lung, lateral aspects of the right lower lobe abutting the pleura, measuring 4.7 cm greatest dimension, most likely atelectasis. Recommend follow-up chest CT at some point to ensure resolution. 4. No acute findings within the lumbar spine. Mild scoliosis. Mild degenerative change within the lumbar spine, as detailed above. No large disc bulge or protrusion seen.  5. Anasarca. 6. Aortic atherosclerosis. CXR 3/10 > Endotracheal tube seen ending 2-3 cm above the carina.Lungs remain hypoexpanded. Patchy bilateral airspace opacities are improved from the recent prior study and may reflect mild pulmonary edema or pneumonia. Borderline cardiomegaly.  CULTURES: Blood 3/10 >> Urine 3/10 >>   ANTIBIOTICS: Rocephin 3/10  Vancomycin 3/11 >> Zosyn 3/11 >>    SIGNIFICANT EVENTS: 3/10 > Presents to OSH   LINES/TUBES: ETT 3/10 >> RIJ 3/10 >>   DISCUSSION: 52 year old female presents to ED with reported left leg numbness for the last few days.  Vascular was consulted as patient was found to have no pulses below the knee. Intubated for worsening hypoxia and tachypnea. Transferred to [facility] for further work-up.   ASSESSMENT / PLAN:  PULMONARY A: Acute Hypoxic Respiratory Failure  Masslike consolidation within the right lower lung, lateral aspects of the right lower lobe abutting the pleura, measuring 4.7 H/O Tobacco Use  P:   Vent Support Trend ABG/CXR Pulmonary Hygiene   CARDIOVASCULAR A:  Hypotension in setting of hypovolemia, hemorraghic, or septic shock  +Hematemesis and Hematochezia   +Bacteria in Urine, however Leukocytes and Nitrite negative   New A.Fib  Ischemic Left Foot  P:  Cardiac Monitoring  Wean Neo to maintain MAP >65, Add vasopressin  Vascular Consulted > Taking Emergently to OR for left femoral thrombectomy and possible popliteal exploration with possible bypass and fasciotomies  ECHO pending  Trend Troponin   RENAL A:   Anion Gap Metabolic Acidosis with Lactic Acidosis  Hypomagnesemia  S/P 2L NS  P:   Trend BMP  Replace electrolytes as indicated > Mg replacement ordered  Trend LA  NS @ 75 ml/hr   GASTROINTESTINAL A:   Acholic Liver Cirrhosis  +Hematemesis and Hematochezia   Esophageal Varices  P:   NPO Consult GI  NO OG tube  Continue Protonix gtt Start Octreotide gtt   HEMATOLOGIC A:   Chronic Anemia  +Hematemesis and Hematochezia Thrombocytopenia  P:  Trend CBC  Maintain Hbg >7  Hold anticoagulation due to reported bleeding  Spoke with Dr. Arbie Cookey will transfuse 2 packs plts now   INFECTIOUS A:   Bacteremia > Cultures with GPC U/A with many bacteria  P:   Trend WBC and Fever Curve Trend LA and PCT  Vancomycin and Zosyn  Follow Culture Data   ENDOCRINE A:   No issues    P:   Trend Glucose  Cortisol pending   NEUROLOGIC A:   ETOH withdrawal  H/O ETOH Abuse  P:   RASS goal: 0/-1 Wean Precedex/Fentanyl to achieve RASS Folic Acid/Thimaine   FAMILY  -  Updates: Family updated at bedside   - Inter-disciplinary family meet or Palliative Care meeting due by:  07/02/2017   Jovita Kussmaul, AGACNP-BC [facility]  Pgr: [phone]  PCCM Pgr: [phone]

## 2017-06-25 NOTE — Consult Note (Signed)
Vascular and Vein Specialist of [facility]  Patient name: Whitney Mathis MRN: [id number] DOB: 1966/03/10 Sex: female  REASON FOR CONSULT: Left leg ischemia  HPI: Sydna Brodowski is a 52 y.o. female, who is just been transferred to the medical critical care unit at [facility].  She had presented to [facility] around 11 AM this morning complaining of numbness in her left leg.  There was concern regarding potential degenerative disc disease and she had a workup to include a noncontrast CT and a CT of her spine.  She has a history of alcoholic cirrhosis and while at the outlying hospital began to become more unstable and had evidence of lower GI bleeding and potential alcohol withdrawal.  She was intubated with plans to transfer to [facility].  In preparation for transport she was noted to have ischemia of her left leg and I was notified that she would be transferred.  I saw her immediately on arrival at [facility].  She clearly has a profoundly ischemic left foot.  She is intubated and unresponsive currently.  I did discuss her history with her husband who is present.  He reports that over the past several days she was having pain and numbness in her left foot and it progressed to where she sought medical care today.  Does have a history of degenerative disc disease but this is not new regarding her left leg numbness.  No history of cardiac arrhythmia that would suggest cardiogenic source for embolus.  She does continue to actively drink alcohol.  She has been seen in the [facility] liver cirrhosis clinic and apparently there were plans for banding of her esophageal varices according to the husband.  Past Medical History:  Diagnosis Date  . Alcohol dependence (HCC)    "stopped drinking in 09/19/16" - Update: continues to consume etoh (05/2017 [facility] note)  . Chronic back pain   . Chronic pain   . Cirrhosis (HCC)   . Jaundice   . Lumbar  radiculopathy   . Tobacco use     Family History  Problem Relation Age of Onset  . Bone cancer Mother 52    SOCIAL HISTORY: Social History   Socioeconomic History  . Marital status: Married    Spouse name: Not on file  . Number of children: Not on file  . Years of education: Not on file  . Highest education level: Not on file  Social Needs  . Financial resource strain: Not on file  . Food insecurity - worry: Not on file  . Food insecurity - inability: Not on file  . Transportation needs - medical: Not on file  . Transportation needs - non-medical: Not on file  Occupational History  . Occupation: housewife  Tobacco Use  . Smoking status: Current Every Day Smoker    Packs/day: 0.25    Types: Cigarettes    Start date: 59  . Smokeless tobacco: Never Used  Substance and Sexual Activity  . Alcohol use: Yes    Frequency: Never  . Drug use: No  . Sexual activity: Not on file  Other Topics Concern  . Not on file  Social History Narrative  . Not on file    Allergies  Allergen Reactions  . Codeine Nausea And Vomiting    Current Facility-Administered Medications  Medication Dose Route Frequency Provider Last Rate Last Dose  . 0.9 %  sodium chloride infusion   Intravenous STAT Samuel Jester, DO 125 mL/hr at 2017/07/24 1544    .  0.9 %  sodium chloride infusion  250 mL Intravenous PRN Tobey GrimEubanks, Katalina M, NP      . cefTRIAXone (ROCEPHIN) 1 g in sodium chloride 0.9 % 100 mL IVPB  1 g Intravenous Q24H Hammonds, Curt JewsKathleen H, MD      . dexmedetomidine (PRECEDEX) 200 mcg in sodium chloride 0.9 % 50 mL (4 mcg/mL) infusion  0-1.2 mcg/kg/hr Intravenous Continuous Tobey GrimEubanks, Katalina M, NP 8.1 mL/hr at 07/01/2017 2106 0.5 mcg/kg/hr at 07/12/2017 2106  . fentaNYL (SUBLIMAZE) bolus via infusion 50 mcg  50 mcg Intravenous Q1H PRN Tobey GrimEubanks, Katalina M, NP   50 mcg at 07/04/2017 2115  . fentaNYL (SUBLIMAZE) injection 50 mcg  50 mcg Intravenous Once Tobey GrimEubanks, Katalina M, NP      . fentaNYL 2500mcg  in NS 250mL (7710mcg/ml) infusion-PREMIX  25-400 mcg/hr Intravenous Continuous Tobey GrimEubanks, Katalina M, NP      . Melene Muller[START ON 06/26/2017] folic acid injection 1 mg  1 mg Intravenous Daily Jovita KussmaulEubanks, Katalina M, NP      . ketamine (KETALAR) injection 64 mg  1 mg/kg Intravenous Once Eber HongMiller, Brian, MD      . LORazepam (ATIVAN) 2 MG/ML injection           . LORazepam (ATIVAN) 2 MG/ML injection           . octreotide (SANDOSTATIN) 500 mcg in sodium chloride 0.9 % 250 mL (2 mcg/mL) infusion  50 mcg/hr Intravenous Continuous Tobey GrimEubanks, Katalina M, NP 25 mL/hr at 07/04/2017 2114 50 mcg/hr at 07/12/2017 2114  . ondansetron (ZOFRAN) injection 4 mg  4 mg Intravenous Q6H PRN Tobey GrimEubanks, Katalina M, NP   4 mg at 07/16/2017 2104  . pantoprazole (PROTONIX) 80 mg in sodium chloride 0.9 % 250 mL (0.32 mg/mL) infusion  8 mg/hr Intravenous Continuous Samuel JesterMcManus, Kathleen, DO 25 mL/hr at 06/21/2017 2115 8 mg/hr at 06/17/2017 2115  . PHENYLephrine 40 mcg/ml in normal saline Adult IV Push Syringe  200 mcg Intravenous Once PRN Jovita KussmaulEubanks, Katalina M, NP      . sodium chloride 0.9 % bolus 1,000 mL  1,000 mL Intravenous Once Tobey GrimEubanks, Katalina M, NP      . thiamine (VITAMIN B-1) tablet 100 mg  100 mg Oral Daily Samuel JesterMcManus, Kathleen, DO       Or  . thiamine (B-1) injection 100 mg  100 mg Intravenous Daily Samuel JesterMcManus, Kathleen, DO   100 mg at 07/10/2017 1250    REVIEW OF SYSTEMS:  As per her past history.  Unable to obtain further review of systems due to her intubation PHYSICAL EXAM: Vitals:   06/17/2017 2100 06/24/2017 2110 07/02/2017 2115 06/17/2017 2120  BP: (!) 95/53 (!) 113/47 (!) 72/48 (!) 62/37  Pulse: (!) 145 (!) 144 (!) 148 (!) 147  Resp: (!) 27 (!) 26 (!) 24 (!) 0  Temp: 98.7 F (37.1 C)     TempSrc: Oral     SpO2: 99% 100% 100% 100%  Weight: 179 lb 0.2 oz (81.2 kg)     Height:        GENERAL: The patient is a well-nourished female, in no acute distress. The vital signs are documented above. CARDIOVASCULAR: Palpable radial pulses.  2+ femoral  pulses bilaterally.  She does have biphasic Doppler flow in her right foot and no flow in her left foot.  Her heart rate is 160 making pedal pulse palpation difficult on the right.  Her left foot is cold from just below the knee and to her foot. PULMONARY: There is good air exchange  ABDOMEN:  Soft and non-tender  MUSCULOSKELETAL: There are no major deformities or cyanosis. NEUROLOGIC: Unable to determine her motor and sensory function in her foot due to her intubation status SKIN: There are no ulcers or rashes noted. PSYCHIATRIC: The patient has a normal affect.  DATA:  Noncontrast contrast CT shows location of her aorta and cirrhosis of the liver.  Otherwise not much help  MEDICAL ISSUES: Profound ischemia of left foot probably of several days' duration.  I discussed this with the husband present.  I recommended immediate operative intervention.  Splane that she obviously is critically ill from other issues as well.  I am concerned regarding the viability of her left foot with prolonged ischemia time.  Have explained the procedure of left femoral thrombectomy and possible popliteal exploration and possible bypass and possible fasciotomies.  We will proceed as soon as possible.  She is tachycardic but currently hemodynamically stable   Larina Earthly, MD [facility] Vascular and Vein Specialists of [facility] Tel [phone] Pager [phone]

## 2017-06-25 NOTE — ED Notes (Signed)
Patient placed on bed pan. Pt had black tarry stool.

## 2017-06-25 NOTE — Progress Notes (Signed)
Pt has high anxiety and asking for pain medicine. Pt has wheezes in her neck but her lungs are clear. RT will continue to monitor.

## 2017-06-25 NOTE — ED Provider Notes (Addendum)
The patient is accepted at change of shift, she is 52 years old has a history of significant alcohol use and has not had alcohol in several days, presents with severe tremulous, tachycardia, shortness of breath and likely gastrointestinal bleeding given black stools.  Labs have been abnormal showing multiple abnormalities including a leukocytosis, and anion gap acidosis and liver failure with a bilirubin over 11.  Ammonia was negative, CT scan performed, no obvious pneumonias, currently receiving nasal cannula but continues to have intermittent desaturations which seem to resolve with having the patient stop talking.  She has significant tremors of the bilateral upper extremities but is able to follow commands and answer questions.  She is tachycardic to between 150 and 160, she is getting multiple IV fluid boluses, multiple doses of Ativan and will give another 2 mg at this time.  Pending transfer to [facility] ICU.  The patient is critically ill but at this time is maintaining her airway  4:37 PM  Procedure Name: Intubation Date/Time: 06/20/2017 7:02 PM Performed by: Noemi Chapel, MD Pre-anesthesia Checklist: Patient identified, Patient being monitored, Emergency Drugs available, Timeout performed and Suction available Oxygen Delivery Method: Non-rebreather mask Preoxygenation: Pre-oxygenation with 100% oxygen Induction Type: Rapid sequence Ventilation: Mask ventilation without difficulty Laryngoscope Size: Mac and 3 Grade View: Grade I Tube size: 7.5 mm Number of attempts: 1 Airway Equipment and Method: Stylet Placement Confirmation: ETT inserted through vocal cords under direct vision,  CO2 detector and Breath sounds checked- equal and bilateral Secured at: 22 cm Tube secured with: ETT holder Dental Injury: Teeth and Oropharynx as per pre-operative assessment       Due to the patient's ongoing agitation as well as her ongoing signs of alcohol withdrawal the decision was made to use  ketamine for induction rather than etomidate or propofol.  Her blood pressure has been borderline at between 90 and 408 systolic and she has been persistently tachycardic.  She has had multiple doses of Ativan, she is getting another dose of Ativan as we speak.  Intubation was successful on the first attempt without any difficulty.  Critical care transport team is at the bedside at this time ready to transport the patient.  She will be started on a Versed drip if possible, she has not had any hemoptysis but due to the risk of variceal bleeding hemoptysis ongoing confusion progressive agitation, intermittent episodes of hypoxia the decision was made to secure the airway prior to transport.  7:04 PM   Prior to transport it was noted that the patient started to have decreased temperature of her left leg, it was cool to the touch and on my exam her pulses were not dopplerable at the foot or the popliteal artery however she did have femoral artery pulses.  Her blood pressure is 95 systolic.  I have discussed her care with Dr. Sherren Mocha early who has been kind enough to agree to consult once the patient has arrived to the intensive care unit.  CRITICAL CARE Performed by: Johnna Acosta Total critical care time: 60 minutes Critical care time was exclusive of separately billable procedures and treating other patients. Critical care was necessary to treat or prevent imminent or life-threatening deterioration. Critical care was time spent personally by me on the following activities: development of treatment plan with patient and/or surrogate as well as nursing, discussions with consultants, evaluation of patient's response to treatment, examination of patient, obtaining history from patient or surrogate, ordering and performing treatments and interventions, ordering and review of  laboratory studies, ordering and review of radiographic studies, pulse oximetry and re-evaluation of patient's condition.   Final  diagnoses:  Chronic back pain  Melena  Hypomagnesemia  Alcohol withdrawal syndrome with complication (HCC)  Decompensated hepatic cirrhosis (HCC)  Anemia, unspecified type  Thrombocytopenia ([place])  Elevated troponin      Noemi Chapel, MD 07/11/2017 [place]    Noemi Chapel, MD 07/13/2017 [phone]

## 2017-06-25 NOTE — Procedures (Signed)
Central Venous Catheter Insertion Procedure Note Whitney Mathis [id number] 09/20/1965  Procedure: Insertion of Central Venous Catheter Indications: Assessment of intravascular volume, Drug and/or fluid administration and Frequent blood sampling  Procedure Details Consent: Risks of procedure as well as the alternatives and risks of each were explained to the (patient/caregiver).  Consent for procedure obtained. Time Out: Verified patient identification, verified procedure, site/side was marked, verified correct patient position, special equipment/implants available, medications/allergies/relevent history reviewed, required imaging and test results available.  Performed  Maximum sterile technique was used including antiseptics, cap, gloves, gown, hand hygiene, mask and sheet. Skin prep: Chlorhexidine; local anesthetic administered A antimicrobial bonded/coated triple lumen catheter was placed in the right internal jugular vein using the Seldinger technique.  Evaluation Blood flow good Complications: No apparent complications Patient did tolerate procedure well. Chest X-ray ordered to verify placement.  CXR: pending.  Whitney Mathis, AGACNP-BC [facility]  Pgr: [phone]  PCCM Pgr: [phone]

## 2017-06-25 NOTE — ED Notes (Addendum)
"  Kim" called to speak to Mrs Whitney Mathis but was unable to understand her. Kim asked this tech what was going on, was informed that I cannot give her any info without pt consent. Tech told Selena BattenKim I would return her phone call if I could obtain consent to give her any information. Pt speech cannot be understood for consent.

## 2017-06-25 NOTE — ED Notes (Signed)
Patient had black watery stool. Patient cleaned and changed.

## 2017-06-25 NOTE — ED Notes (Signed)
Date and time results received: 04-27-17 1:01 PM  (use smartphrase ".now" to insert current time)  Test: Troponin Critical Value: 0.29  Name of Provider Notified: Clarene DukeMcManus  Orders Received? Or Actions Taken?: Orders Received - See Orders for details

## 2017-06-25 NOTE — Anesthesia Procedure Notes (Signed)
Arterial Line Insertion Performed by: Tillman AbideHawkins, Yexalen Deike B, CRNA, CRNA  Preanesthetic checklist: patient identified, IV checked, site marked, risks and benefits discussed, surgical consent, monitors and equipment checked, pre-op evaluation and timeout performed Right, radial was placed Catheter size: 20 G Hand hygiene performed , maximum sterile barriers used  and Seldinger technique used Allen's test indicative of satisfactory collateral circulation Attempts: 1 Procedure performed without using ultrasound guided technique. Following insertion, Biopatch and dressing applied. Post procedure assessment: normal  Patient tolerated the procedure well with no immediate complications.

## 2017-06-25 NOTE — Progress Notes (Signed)
Pharmacy Antibiotic Note Whitney Mathis is a 52 y.o. female admitted on 11-04-2017 with UTI.  Pharmacy has been consulted for ceftriaxone dosing.  Plan: 1. Ceftriaxone 1 gram IV every 24 hours  2. Will follow peripherally   Height: 5\' 4"  (162.6 cm) Weight: 142 lb (64.4 kg) IBW/kg (Calculated) : 54.7  Temp (24hrs), Avg:98.1 F (36.7 C), Min:98 F (36.7 C), Max:98.2 F (36.8 C)  Recent Labs  Lab 2017-12-12 1203 2017-12-12 1648  WBC 25.1*  --   CREATININE 0.84 0.90    Estimated Creatinine Clearance: 63.9 mL/min (by C-G formula based on SCr of 0.9 mg/dL).    Allergies  Allergen Reactions  . Codeine Nausea And Vomiting    Thank you for allowing pharmacy to be a part of this patient's care.  Pollyann SamplesAndy Ean Gettel, PharmD, BCPS 11-04-2017, 9:06 PM

## 2017-06-25 NOTE — ED Provider Notes (Signed)
[facility] EMERGENCY DEPARTMENT Provider Note   CSN: [id number] Arrival date & time: 06-Feb-2018  1111     History   Chief Complaint Chief Complaint  Patient presents with  . Back Pain    HPI Whitney Mathis is a 52 y.o. female.  HPI Pt was seen at 1130. Per pt, c/o gradual onset and persistence of constant acute flair of her chronic low back "pain" for the past 3 weeks.  Denies any change in her usual chronic pain pattern.  Pain worsens with palpation of the area and body position changes. Pt states her pain radiates into her left leg. Pt states her entire left leg "is numb" since she woke up this morning. Pt's husband states pt "couldn't stand up" this morning due to generalized weakness. Pt immediately is asking for pain medication on arrival to ED. Pt has hx cirrhosis d/t etoh; is chronically jaundiced. Denies incont/retention of bowel or bladder, no saddle anesthesia, no focal motor weakness, no fevers, no injury, no abd pain, no CP/palpitations.     Past Medical History:  Diagnosis Date  . Alcohol dependence (HCC)    "stopped drinking in 09/19/16" - Update: continues to consume etoh (05/2017 [facility] note)  . Chronic back pain   . Chronic pain   . Cirrhosis (HCC)   . Jaundice   . Lumbar radiculopathy   . Tobacco use     Patient Active Problem List   Diagnosis Date Noted  . Dental abscess 12/13/2016  . Alcoholic cirrhosis of liver with ascites (HCC) 12/13/2016  . Tobacco dependence 12/13/2016    Past Surgical History:  Procedure Laterality Date  . JOINT REPLACEMENT Right    hip  . LEG SURGERY     right  . TONSILLECTOMY      OB History    No data available       Home Medications    Prior to Admission medications   Medication Sig Start Date End Date Taking? Authorizing Provider  amoxicillin-clavulanate (AUGMENTIN) 875-125 MG tablet Take 1 tablet by mouth 2 (two) times daily. 12/14/16   Standley BrookingGoodrich, Daniel P, MD  furosemide (LASIX) 40 MG tablet Take 40 mg by mouth  daily.  12/01/16   [provider]  gabapentin (NEURONTIN) 600 MG tablet Take one tablet twice a day. 12/01/16   [provider]  HYDROcodone-acetaminophen (LORTAB) 5-325 MG tablet Take 1 tablet by mouth every 6 (six) hours as needed for moderate pain. 12/14/16   Standley BrookingGoodrich, Daniel P, MD    Family History Family History  Problem Relation Age of Onset  . Bone cancer Mother 2962    Social History Social History   Tobacco Use  . Smoking status: Current Every Day Smoker    Packs/day: 0.25    Types: Cigarettes    Start date: 381983  . Smokeless tobacco: Never Used  Substance Use Topics  . Alcohol use: Yes    Frequency: Never  . Drug use: No     Allergies   Codeine   Review of Systems Review of Systems ROS: Statement: All systems negative except as marked or noted in the HPI; Constitutional: Negative for fever and chills. +generalized weakness.; ; Eyes: Negative for eye pain, redness and discharge. ; ; ENMT: Negative for ear pain, hoarseness, nasal congestion, sinus pressure and sore throat. ; ; Cardiovascular: Negative for chest pain, palpitations, diaphoresis, and peripheral edema. ; ; Respiratory: +wheezing, SOB. Negative for cough and stridor. ; ; Gastrointestinal: +N/V, black stool. Negative for abdominal pain, blood in  stool, hematemesis, jaundice and rectal bleeding. . ; ; Genitourinary: Negative for dysuria, flank pain and hematuria. ; ; Musculoskeletal: +chronic LBP, left leg "numbness." Negative for neck pain. Negative for swelling and trauma.; ; Skin: Negative for pruritus, rash, abrasions, blisters, bruising and skin lesion.; ; Neuro: Negative for headache, lightheadedness and neck stiffness. Negative for altered level of consciousness, altered mental status, extremity weakness, involuntary movement, seizure and syncope.     Physical Exam Updated Vital Signs BP 109/63 (BP Location: Left Arm)   Pulse (!) 126   Temp 98 F (36.7 C) (Oral)   Resp 18   Ht 5\' 4"   (1.626 m)   Wt 64.4 kg (142 lb)   SpO2 94%   BMI 24.37 kg/m   Patient Vitals for the past 24 hrs:  BP Temp Temp src Pulse Resp SpO2 Height Weight  07/14/2017 1600 107/65 - - (!) 156 (!) 29 96 % - -  06/27/2017 1500 (!) 145/69 - - - 13 - - -  06/16/2017 1453 - - - (!) 141 20 99 % - -  07/04/2017 1445 - - - (!) 143 (!) 22 100 % - -  07/10/2017 1400 129/80 - - (!) 140 (!) 27 93 % - -  07/12/2017 1356 - - - (!) 140 (!) 25 94 % - -  06/26/2017 1348 - - - (!) 142 (!) 27 96 % - -  06/24/2017 1343 - - - - - 90 % - -  07/09/2017 1330 140/66 - - (!) 134 - - - -  07/09/2017 1301 - - - - - 95 % - -  07/15/2017 1248 140/66 - - (!) 134 (!) 36 93 % - -  07/13/2017 1221 134/76 - - (!) 138 - - - -  07/12/2017 1200 134/76 - - (!) 138 (!) 42 91 % - -  07/11/2017 1116 109/63 98 F (36.7 C) Oral (!) 126 18 94 % 5\' 4"  (1.626 m) 64.4 kg (142 lb)     Physical Exam 1135: Physical examination:  Nursing notes reviewed; Vital signs and O2 SAT reviewed;  Constitutional: Well developed, Well nourished, Uncomfortable appearing.; Head:  Normocephalic, atraumatic; Eyes: EOMI, PERRL, +scleral icterus; ENMT: Mouth and pharynx normal, Mucous membranes dry; Neck: Supple, Full range of motion, No lymphadenopathy; Cardiovascular: Tachycardic rate and rhythm, No gallop; Respiratory: Breath sounds diminished & equal bilaterally, exp wheezes bilat. +audible wheezing. Speaking short sentences, sitting upright. Tachypneic. Normal respiratory effort/excursion; Chest: Nontender, Movement normal; Abdomen: Soft, Nontender, Nondistended, Normal bowel sounds; Genitourinary: No CVA tenderness; Spine:  No midline CS, TS, LS tenderness. +TTP left lumbar paraspinal muscles.;; Extremities: Peripheral pulses normal. Pelvis stable. NT left hip/knee/ankle/foot. No deformity, +tr pedal edema bilat. No calf tenderness, edema or asymmetry. +very tremulous.; Neuro: AA&Ox3. Major CN grossly intact. No facial droop. Speech clear. Strength 5/5 equal bilat UE's and LE's, including  great toe dorsiflexion. No gross sensory deficits. Neg straight leg raises bilat..; Skin: Color jaundiced, Warm, Dry.    ED Treatments / Results  Labs (all labs ordered are listed, but only abnormal results are displayed)   EKG  EKG Interpretation  Date/Time:  Sunday June 25 2017 11:46:30 EDT Ventricular Rate:  135 PR Interval:    QRS Duration: 112 QT Interval:  290 QTC Calculation: 435 R Axis:   68 Text Interpretation:  Sinus tachycardia Borderline intraventricular conduction delay Low voltage, precordial leads Baseline wander When compared with ECG of 12/13/2016 Rate faster Confirmed by Samuel Jester [phone]) on 06/16/2017 11:54:47 AM  Radiology   Procedures Procedures (including critical care time)  Medications Ordered in ED Medications  LORazepam (ATIVAN) injection 0-4 mg (not administered)    Or  LORazepam (ATIVAN) tablet 0-4 mg (not administered)  LORazepam (ATIVAN) injection 0-4 mg (not administered)    Or  LORazepam (ATIVAN) tablet 0-4 mg (not administered)  thiamine (VITAMIN B-1) tablet 100 mg (not administered)    Or  thiamine (B-1) injection 100 mg (not administered)  ondansetron (ZOFRAN) injection 4 mg (not administered)  ipratropium-albuterol (DUONEB) 0.5-2.5 (3) MG/3ML nebulizer solution 3 mL (not administered)  sodium chloride 0.9 % bolus 500 mL (not administered)     Initial Impression / Assessment and Plan / ED Course  I have reviewed the triage vital signs and the nursing notes.  Pertinent labs & imaging results that were available during my care of the patient were reviewed by me and considered in my medical decision making (see chart for details).  MDM Reviewed: previous chart, nursing note and vitals Reviewed previous: labs, ECG, ultrasound and CT scan Interpretation: labs, ECG, x-ray and CT scan Total time providing critical care: 75-105 minutes. This excludes time spent performing separately reportable procedures and  services. Consults: critical care (and [facility] Hospitalist and [facility] Hepatologist)   CRITICAL CARE Performed by: Laray Anger Total critical care time: 90 minutes Critical care time was exclusive of separately billable procedures and treating other patients. Critical care was necessary to treat or prevent imminent or life-threatening deterioration. Critical care was time spent personally by me on the following activities: development of treatment plan with patient and/or surrogate as well as nursing, discussions with consultants, evaluation of patient's response to treatment, examination of patient, obtaining history from patient or surrogate, ordering and performing treatments and interventions, ordering and review of laboratory studies, ordering and review of radiographic studies, pulse oximetry and re-evaluation of patient's condition.  Results for orders placed or performed during the hospital encounter of 06/24/2017  Culture, blood (routine x 2)  Result Value Ref Range   Specimen Description RIGHT ANTECUBITAL    Special Requests      BOTTLES DRAWN AEROBIC AND ANAEROBIC Blood Culture adequate volume Performed at [facility], [address], [place], [place] [address]    Culture PENDING    Report Status PENDING   Culture, blood (routine x 2)  Result Value Ref Range   Specimen Description BLOOD RIGHT ARM    Special Requests      BOTTLES DRAWN AEROBIC AND ANAEROBIC Blood Culture adequate volume Performed at [facility], [address] [place], [place] [address]    Culture PENDING    Report Status PENDING   Comprehensive metabolic panel  Result Value Ref Range   Sodium 130 (L) 135 - 145 mmol/L   Potassium 4.3 3.5 - 5.1 mmol/L   Chloride 97 (L) 101 - 111 mmol/L   CO2 17 (L) 22 - 32 mmol/L   Glucose, Bld 101 (H) 65 - 99 mg/dL   BUN 22 (H) 6 - 20 mg/dL   Creatinine, Ser 0.98 0.44 - 1.00 mg/dL   Calcium 7.9 (L) 8.9 - 10.3 mg/dL   Total Protein 6.0 (L) 6.5 - 8.1 g/dL   Albumin  1.9 (L) 3.5 - 5.0 g/dL   AST 76 (H) 15 - 41 U/L   ALT 31 14 - 54 U/L   Alkaline Phosphatase 168 (H) 38 - 126 U/L   Total Bilirubin 11.1 (H) 0.3 - 1.2 mg/dL   GFR calc non Af Amer >60 >60 mL/min  GFR calc Af Amer >60 >60 mL/min   Anion gap 16 (H) 5 - 15  Ethanol  Result Value Ref Range   Alcohol, Ethyl (B) <10 <10 mg/dL  CBC with Differential  Result Value Ref Range   WBC 25.1 (H) 4.0 - 10.5 K/uL   RBC 3.14 (L) 3.87 - 5.11 MIL/uL   Hemoglobin 9.9 (L) 12.0 - 15.0 g/dL   HCT 16.1 (L) 09.6 - 04.5 %   MCV 95.9 78.0 - 100.0 fL   MCH 31.5 26.0 - 34.0 pg   MCHC 32.9 30.0 - 36.0 g/dL   RDW 40.9 (H) 81.1 - 91.4 %   Platelets 67 (L) 150 - 400 K/uL   Neutrophils Relative % 84 %   Lymphocytes Relative 2 %   Monocytes Relative 14 %   Eosinophils Relative 0 %   Basophils Relative 0 %   Neutro Abs 21.1 (H) 1.7 - 7.7 K/uL   Lymphs Abs 0.5 (L) 0.7 - 4.0 K/uL   Monocytes Absolute 3.5 (H) 0.1 - 1.0 K/uL   Eosinophils Absolute 0.0 0.0 - 0.7 K/uL   Basophils Absolute 0.0 0.0 - 0.1 K/uL   WBC Morphology WHITE COUNT CONFIRMED ON SMEAR   Protime-INR  Result Value Ref Range   Prothrombin Time 22.1 (H) 11.4 - 15.2 seconds   INR 1.96   Brain natriuretic peptide  Result Value Ref Range   B Natriuretic Peptide 677.0 (H) 0.0 - 100.0 pg/mL  Troponin I  Result Value Ref Range   Troponin I 0.29 (HH) <0.03 ng/mL  Lipase, blood  Result Value Ref Range   Lipase 39 11 - 51 U/L  Ammonia  Result Value Ref Range   Ammonia 33 9 - 35 umol/L  Magnesium  Result Value Ref Range   Magnesium 1.0 (L) 1.7 - 2.4 mg/dL  Type and screen [facility]  Result Value Ref Range   ABO/RH(D) O POS    Antibody Screen POS    Sample Expiration      07/10/2017 Performed at [facility], [address], [place], [place] [address]    Ct Abdomen Pelvis Wo Contrast Result Date: 07/16/2017 CLINICAL DATA:  Low back pain radiating into left leg. Left leg numbness. Back pain for 3 weeks, new leg symptoms. Denies  incontinence of stool or urine. History of chronic back pain and hip surgery. EXAM: CT ABDOMEN AND PELVIS WITHOUT CONTRAST CT LUMBAR SPINE WITHOUT CONTRAST TECHNIQUE: Multidetector CT imaging of the abdomen and pelvis was performed following the standard protocol without IV contrast. COMPARISON:  None. FINDINGS: CHEST CT: Lower chest: Masslike consolidation within the lateral aspects of the right lower lobe, abutting the pleura, measuring 4.7 x 1.7 cm, most likely atelectasis. Additional probable atelectasis at the left lung base. Hepatobiliary: Cirrhotic appearing liver. Single gallstone measuring approximately 6 mm. Mild gallbladder wall thickening which is likely related to the adjacent liver disease and/or ascites. No bile duct dilatation. Pancreas: Unremarkable. No pancreatic ductal dilatation or surrounding inflammatory changes. Spleen: Splenomegaly. Adrenals/Urinary Tract: Adrenal glands appear normal. Kidneys are unremarkable without mass, stone or hydronephrosis. No ureteral or bladder calculi identified. Bladder is decompressed. Stomach/Bowel: Bowel is normal in caliber. No bowel wall thickening or evidence of bowel wall inflammation appreciated, although characterization of some portions of the bowel is limited by patient motion artifact. Vascular/Lymphatic: Aortic atherosclerosis. Large varices about the splenic hilum. Abnormally prominent caliber of the left renal vein, of uncertain chronicity, possibly also related to the presumed portal venous hypertension. No enlarged lymph nodes  seen in the abdomen or pelvis. Reproductive: Uterus and bilateral adnexa are unremarkable. Other: Small amount of ascites within the abdomen and pelvis. No abscess collection seen. No free intraperitoneal air. Musculoskeletal: No acute or suspicious osseous finding. Right hip arthroplasty hardware appears appropriately positioned. Ill-defined edema within the subcutaneous soft tissues of the abdomen and pelvis indicating  some degree of anasarca. LUMBAR SPINE CT: Mild dextroscoliosis centered at the L3 vertebral body level. Alignment appears otherwise normal. No evidence of acute vertebral body subluxation. No fracture line or displaced fracture fragment. No acute or suspicious osseous lesion. Mild disc desiccations at the L2-3 and L3-4 levels, with associated mild disc space narrowings. Mild diffuse disc bulge at L5-S1, with probable mild neural foramen encroachment bilaterally. No large disc bulge or protrusion at any level. No significant central canal stenosis at any level. The immediate paravertebral soft tissues are unremarkable. IMPRESSION: 1. Cirrhotic liver. Associated splenomegaly and upper abdominal varices. Associated small amount of ascites in the abdomen and pelvis. 2. Cholelithiasis without evidence of acute cholecystitis. 3. Masslike consolidation within the right lower lung, lateral aspects of the right lower lobe abutting the pleura, measuring 4.7 cm greatest dimension, most likely atelectasis. Recommend follow-up chest CT at some point to ensure resolution. 4. No acute findings within the lumbar spine. Mild scoliosis. Mild degenerative change within the lumbar spine, as detailed above. No large disc bulge or protrusion seen. 5. Anasarca. 6. Aortic atherosclerosis. Electronically Signed   By: Bary Richard M.D.   On: 2017-07-08 13:28   Dg Chest 2 View Result Date: 08-Jul-2017 CLINICAL DATA:  Short of breath. Wheezing. Cirrhosis. Alcohol dependence. EXAM: CHEST - 2 VIEW COMPARISON:  09/08/2016 FINDINGS: Midline trachea. Cardiomegaly, accentuated by AP portable frontal technique. Small right pleural effusion versus pleural thickening, blunting the right costophrenic angle. No pneumothorax. Moderate pulmonary interstitial prominence and indistinctness. No lobar consolidation. IMPRESSION: Cardiomegaly with moderate interstitial edema. Small right pleural effusion versus pleural thickening. Electronically Signed   By:  Jeronimo Greaves M.D.   On: 07-08-17 13:05   Ct L-spine No Charge Result Date: 07/08/2017 CLINICAL DATA:  Low back pain radiating into left leg. Left leg numbness. Back pain for 3 weeks, new leg symptoms. Denies incontinence of stool or urine. History of chronic back pain and hip surgery. EXAM: CT ABDOMEN AND PELVIS WITHOUT CONTRAST CT LUMBAR SPINE WITHOUT CONTRAST TECHNIQUE: Multidetector CT imaging of the abdomen and pelvis was performed following the standard protocol without IV contrast. COMPARISON:  None. FINDINGS: CHEST CT: Lower chest: Masslike consolidation within the lateral aspects of the right lower lobe, abutting the pleura, measuring 4.7 x 1.7 cm, most likely atelectasis. Additional probable atelectasis at the left lung base. Hepatobiliary: Cirrhotic appearing liver. Single gallstone measuring approximately 6 mm. Mild gallbladder wall thickening which is likely related to the adjacent liver disease and/or ascites. No bile duct dilatation. Pancreas: Unremarkable. No pancreatic ductal dilatation or surrounding inflammatory changes. Spleen: Splenomegaly. Adrenals/Urinary Tract: Adrenal glands appear normal. Kidneys are unremarkable without mass, stone or hydronephrosis. No ureteral or bladder calculi identified. Bladder is decompressed. Stomach/Bowel: Bowel is normal in caliber. No bowel wall thickening or evidence of bowel wall inflammation appreciated, although characterization of some portions of the bowel is limited by patient motion artifact. Vascular/Lymphatic: Aortic atherosclerosis. Large varices about the splenic hilum. Abnormally prominent caliber of the left renal vein, of uncertain chronicity, possibly also related to the presumed portal venous hypertension. No enlarged lymph nodes seen in the abdomen or pelvis. Reproductive: Uterus and bilateral adnexa are  unremarkable. Other: Small amount of ascites within the abdomen and pelvis. No abscess collection seen. No free intraperitoneal air.  Musculoskeletal: No acute or suspicious osseous finding. Right hip arthroplasty hardware appears appropriately positioned. Ill-defined edema within the subcutaneous soft tissues of the abdomen and pelvis indicating some degree of anasarca. LUMBAR SPINE CT: Mild dextroscoliosis centered at the L3 vertebral body level. Alignment appears otherwise normal. No evidence of acute vertebral body subluxation. No fracture line or displaced fracture fragment. No acute or suspicious osseous lesion. Mild disc desiccations at the L2-3 and L3-4 levels, with associated mild disc space narrowings. Mild diffuse disc bulge at L5-S1, with probable mild neural foramen encroachment bilaterally. No large disc bulge or protrusion at any level. No significant central canal stenosis at any level. The immediate paravertebral soft tissues are unremarkable. IMPRESSION: 1. Cirrhotic liver. Associated splenomegaly and upper abdominal varices. Associated small amount of ascites in the abdomen and pelvis. 2. Cholelithiasis without evidence of acute cholecystitis. 3. Masslike consolidation within the right lower lung, lateral aspects of the right lower lobe abutting the pleura, measuring 4.7 cm greatest dimension, most likely atelectasis. Recommend follow-up chest CT at some point to ensure resolution. 4. No acute findings within the lumbar spine. Mild scoliosis. Mild degenerative change within the lumbar spine, as detailed above. No large disc bulge or protrusion seen. 5. Anasarca. 6. Aortic atherosclerosis. Electronically Signed   By: Bary Richard M.D.   On: 2017/07/13 13:28      [facility] note: "Impression and Plan Ms. Handshoe has advanced decompensated alcoholic cirrhosis. This is complicated by jaundice and ascites which is now well controlled with lasix 40mg  QD and aldactone 100 mg QD.   As far as her symptoms, these are much better controlled than the last time I saw her but her bilirubin remains to be elevated,  reflecting the fact that she is still drinking (although less than before). She understands that her liver disease will continue to progress as long as she drinks and that she will not be a transplant candidate unless she is sober for at least 6 months. The good news is that she has engaged in [place] and her pastor will come visit her and her husband this coming week.  At this time, I will not make any changes in her medication regimen but will order an [place] for [facility] screening and an EGD for variceal screening. She reports having a normal colonoscopy 5 years ago (?).  Last, he is immune for hepatitis B and will receive her second HAV vaccine today. Next visit 2 months, sooner if needed. I personally performed the service. (TP) MARIA Francoise Ceo, MD Electronically signed by Joya Martyr, MD at 06/15/2017 3:05 PM EST"    Complete Blood Count (CBC) (06/14/2017 2:33 PM EST) Complete Blood Count (CBC) (06/14/2017 2:33 PM EST)  Component Value Ref Range Performed At Pathologist Signature  WBC (White Blood Cell Count) 6.6 3.2 - 9.8 x10^9/L [facility]   Hemoglobin 10.0 (L) 12.0 - 15.5 g/dL [facility]   Hematocrit 30.6 (L) 35.0 - 45.0 % [facility]   Platelets 103 (L) 150 - 450 x10^9/L [facility]   MCV (Mean Corpuscular Volume) 96 80 - 98 fL [facility]   MCH (Mean Corpuscular Hemoglobin) 31.4 26.5 - 34.0 pg [facility]   MCHC (Mean Corpuscular Hemoglobin Concentration) 32.7 31.4 - 36.0 % [facility]   RBC (Red Blood Cell  Count) 3.18 (L) 3.77 - 5.16 x10^12/L [facility]   RDW-CV (Red Cell Distribution Width) 18.4 (H) 11.5 - 14.5 % [facility]   NRBC (Nucleated Red Blood Cell Count) 0.00 0 x10^9/L [facility]   NRBC % (Nucleated Red Blood Cell %) 0.0 % [facility]   MPV (Mean Platelet Volume) 9.3 7.2 - 11.7 fL [facility]   Slide Review/Morphology YesComment: Blood film reviewed, instrument counts confirmed,Poikilocytosis,  [facility]   Immature Platelet Fraction 3.8 1.6 - 8.5 % [facility]    Prothrombin Time (INR) (06/14/2017 2:33 PM EST) Prothrombin Time (INR) (06/14/2017 2:33 PM EST)  Component Value Ref Range Performed At Pathologist Signature  Prothrombin Time 19.7 (H) 9.5 - 13.1 sec [facility]   Prothrombin INR 1.7 (H) Comment:  Reference Ranges: DVT/PE/PVD = INR 2.0 - 3.0 Mechanical Heart Valve = INR 2.5 - 3.5  NOTE: The INR is not a PT Ratio and is valid only for Coumadin patients  0.9 - 1.1        1135:  Pt immediately asking for pain meds on arrival to ED. Evasive when answering questions re: cc/HPI, PMHx. Family at bedside contributing minimally. Pt also asked for bedpan to urinate during my exam. Pt with increased SOB and audible wheezing when moving around to place herself on bedpan. Pt is very tremulous. Had one episode of N/V after arrival to ED.  Pt states she "doesn't drink anymore," but [facility] office notes state pt continues to drink etoh and pt reported most recent use was last month. Pt stated initially that she "hardly smokes," but [facility] notes state pt continues to smoke cigarettes daily. Pt endorses chronic jaundice which concurs with [facility] office notes. Pt initially denied she had chronic LBP with radiculopathy, then admitted to same after I queried her recent visit to [facility]. Pt stated her entire left leg was "numb," but she moves it easily and fully on stretcher, as well as lifts/holds her left (and right) leg when I lightly touch with one finger each leg and ask "please lift your leg and hold it;" doubt stroke. I do not believe all of pt's symptoms today are d/t chronic back pain flair. Will broaden workup and order CIWA  protocol.   1200:  Pt had diarrheal stool on bedpan: black, heme positive. I cannot find EGD in Care Everywhere or Epic. See [facility] office note above from 06/15/2017 above.    1300:   IVF bolus given for tachycardia. Pt had another black stool: IV protonix bolus and gtt ordered. H/H per baseline at [facility] last month (above). IV magnesium also ordered. Troponin elevated, but no old to compare and pt denies CP. BNP elevated, no old to compare; CT scan without pulmonary effusions and no acute CHF on CXR.  Short neb x2 given for wheezing; believe wheezing is actually upper airway (vs lungs) d/t pt loudly exhaling, as IV ativan improves this noise.  IV ativan continues for elevated CIWA and tremulousness.  1500:  Pt finally admits LD etoh was 3 to 4 days ago. This would be c/w VS and clinical presentation (etoh withdrawal). IV ativan 6mg  total given. No clear esophageal varices on CT scan, no hx EGD on file. T/C returned from [facility] Hepatologist Dr. Ileene Musa and Hosiptalist Dr. Filomena Jungling, case discussed, including:  HPI, pertinent PM/SHx, VS/PE, dx testing, ED course and treatment:  Agrees with ED  treatment, both state pt does not need octreotide gtt at this time, continue protonix gtt, pt needs tx for acute etoh withdrawal and GI bleed (EGD), pt needs ICU care but does not need specifically Hepatologist (she is not on transplant list due to continued etoh intake), recommends to transfer to higher level of care within our hospital system (pt does not need to be transferred to [facility]).   1535:  T/C returned from [facility] PCCM Dr. Sherren Kerns, case discussed, including:  HPI, pertinent PM/SHx, VS/PE, dx testing, ED course and treatment:  Agrees pt is at high risk for decompensation from a number of medical issues, agreeable to accept transfer/admit to ICU.   1630:  Pt had another black stool; will re-check H/H. Pt continues tachycardic, no fever. IVF bolus ordered. Pt continues to mentate well,  answering questions appropriately. Tremors significantly less after IV ativan, and pt is laying on stretcher semi-fowler's position with one arm behind her head, appearing comfortable talking with ED staff. Will continue CIWA/IV ativan, IVF boluses, IV protonix. Bed assignment at [facility] ICU pending. Given pt's complex hx and presentation today, she is at high risk for decompensation. EDP Dr. Hyacinth Meeker given report on pt with bedside rounds while awaiting bed assignment and transport to [facility].         Final Clinical Impressions(s) / ED Diagnoses   Final diagnoses:  Chronic back pain    ED Discharge Orders    None       Samuel Jester, DO 06/26/17 1105

## 2017-06-25 NOTE — ED Notes (Signed)
Date and time results received: 07/13/2017 1:06 PM  (use smartphrase ".now" to insert current time)  Test: Troponin Critical Value: 0.29  Name of Provider Notified: McMannus  Orders Received? Or Actions Taken?: Orders Received - See Orders for details

## 2017-06-25 NOTE — Anesthesia Preprocedure Evaluation (Addendum)
Anesthesia Evaluation  Patient identified by MRN, date of birth, ID band Patient unresponsive  Preop documentation limited or incomplete due to emergent nature of procedure.  Airway Mallampati: Intubated       Dental   Pulmonary Current Smoker,           Cardiovascular + Peripheral Vascular Disease   Rhythm:Regular Rate:Tachycardia     Neuro/Psych    GI/Hepatic (+) Cirrhosis       ,   Endo/Other    Renal/GU      Musculoskeletal   Abdominal   Peds  Hematology   Anesthesia Other Findings   Reproductive/Obstetrics                            Anesthesia Physical Anesthesia Plan  ASA: IV  Anesthesia Plan: General   Post-op Pain Management:    Induction: Intravenous  PONV Risk Score and Plan: Treatment may vary due to age or medical condition  Airway Management Planned: Oral ETT  Additional Equipment:   Intra-op Plan:   Post-operative Plan: Post-operative intubation/ventilation  Informed Consent: I have reviewed the patients History and Physical, chart, labs and discussed the procedure including the risks, benefits and alternatives for the proposed anesthesia with the patient or authorized representative who has indicated his/her understanding and acceptance.     Plan Discussed with: CRNA, Anesthesiologist and Surgeon  Anesthesia Plan Comments:        Anesthesia Quick Evaluation

## 2017-06-25 NOTE — ED Notes (Signed)
Patient states that she has already urinated. Patient was informed that we need a urine sample.

## 2017-06-25 NOTE — ED Triage Notes (Signed)
Patient c/o lower back pain that radiates into left leg. Per patient left leg is numb. Patient states back pain x3 weeks but leg pain began this morning. Denies any incontinence of stool or urine.

## 2017-06-26 ENCOUNTER — Encounter (HOSPITAL_COMMUNITY): Payer: Self-pay | Admitting: Vascular Surgery

## 2017-06-26 ENCOUNTER — Inpatient Hospital Stay (HOSPITAL_COMMUNITY): Payer: BLUE CROSS/BLUE SHIELD

## 2017-06-26 DIAGNOSIS — N179 Acute kidney failure, unspecified: Secondary | ICD-10-CM

## 2017-06-26 DIAGNOSIS — A419 Sepsis, unspecified organism: Principal | ICD-10-CM

## 2017-06-26 DIAGNOSIS — M79A22 Nontraumatic compartment syndrome of left lower extremity: Secondary | ICD-10-CM

## 2017-06-26 DIAGNOSIS — K921 Melena: Secondary | ICD-10-CM

## 2017-06-26 DIAGNOSIS — K7290 Hepatic failure, unspecified without coma: Secondary | ICD-10-CM

## 2017-06-26 DIAGNOSIS — R918 Other nonspecific abnormal finding of lung field: Secondary | ICD-10-CM

## 2017-06-26 DIAGNOSIS — M6282 Rhabdomyolysis: Secondary | ICD-10-CM

## 2017-06-26 DIAGNOSIS — I749 Embolism and thrombosis of unspecified artery: Secondary | ICD-10-CM

## 2017-06-26 DIAGNOSIS — R6521 Severe sepsis with septic shock: Secondary | ICD-10-CM

## 2017-06-26 DIAGNOSIS — I342 Nonrheumatic mitral (valve) stenosis: Secondary | ICD-10-CM

## 2017-06-26 DIAGNOSIS — F10239 Alcohol dependence with withdrawal, unspecified: Secondary | ICD-10-CM

## 2017-06-26 DIAGNOSIS — K922 Gastrointestinal hemorrhage, unspecified: Secondary | ICD-10-CM

## 2017-06-26 DIAGNOSIS — I4891 Unspecified atrial fibrillation: Secondary | ICD-10-CM

## 2017-06-26 DIAGNOSIS — I214 Non-ST elevation (NSTEMI) myocardial infarction: Secondary | ICD-10-CM

## 2017-06-26 DIAGNOSIS — I998 Other disorder of circulatory system: Secondary | ICD-10-CM

## 2017-06-26 LAB — PHOSPHORUS: Phosphorus: 6.8 mg/dL — ABNORMAL HIGH (ref 2.5–4.6)

## 2017-06-26 LAB — TROPONIN I
TROPONIN I: 0.31 ng/mL — AB (ref <0.03)
Troponin I: 0.3 ng/mL (ref <0.03)

## 2017-06-26 LAB — PREPARE PLATELET PHERESIS
Unit division: 0
Unit division: 0
Unit division: 0

## 2017-06-26 LAB — POCT I-STAT 3, ART BLOOD GAS (G3+)
ACID-BASE DEFICIT: 5 mmol/L — AB (ref 0.0–2.0)
ACID-BASE DEFICIT: 6 mmol/L — AB (ref 0.0–2.0)
BICARBONATE: 21.4 mmol/L (ref 20.0–28.0)
Bicarbonate: 19.2 mmol/L — ABNORMAL LOW (ref 20.0–28.0)
O2 SAT: 95 %
O2 Saturation: 100 %
PH ART: 7.291 — AB (ref 7.350–7.450)
PO2 ART: 187 mmHg — AB (ref 83.0–108.0)
Patient temperature: 98.7
TCO2: 23 mmol/L (ref 22–32)
TCO2: 20 mmol/L — ABNORMAL LOW (ref 22–32)
pCO2 arterial: 44.4 mmHg (ref 32.0–48.0)
pCO2 arterial: 37.6 mmHg (ref 32.0–48.0)
pH, Arterial: 7.316 — ABNORMAL LOW (ref 7.350–7.450)
pO2, Arterial: 85 mmHg (ref 83.0–108.0)

## 2017-06-26 LAB — BLOOD CULTURE ID PANEL (REFLEXED)
ACINETOBACTER BAUMANNII: NOT DETECTED
Candida albicans: NOT DETECTED
Candida glabrata: NOT DETECTED
Candida krusei: NOT DETECTED
Candida parapsilosis: NOT DETECTED
Candida tropicalis: NOT DETECTED
ENTEROCOCCUS SPECIES: NOT DETECTED
Enterobacter cloacae complex: NOT DETECTED
Enterobacteriaceae species: NOT DETECTED
Escherichia coli: NOT DETECTED
HAEMOPHILUS INFLUENZAE: NOT DETECTED
Klebsiella oxytoca: NOT DETECTED
Klebsiella pneumoniae: NOT DETECTED
LISTERIA MONOCYTOGENES: NOT DETECTED
METHICILLIN RESISTANCE: NOT DETECTED
Neisseria meningitidis: NOT DETECTED
PSEUDOMONAS AERUGINOSA: NOT DETECTED
Proteus species: NOT DETECTED
STAPHYLOCOCCUS AUREUS BCID: NOT DETECTED
STREPTOCOCCUS AGALACTIAE: NOT DETECTED
STREPTOCOCCUS PNEUMONIAE: NOT DETECTED
Serratia marcescens: NOT DETECTED
Staphylococcus species: DETECTED — AB
Streptococcus pyogenes: NOT DETECTED
Streptococcus species: NOT DETECTED

## 2017-06-26 LAB — BASIC METABOLIC PANEL
Anion gap: 11 (ref 5–15)
BUN: 27 mg/dL — AB (ref 6–20)
CO2: 18 mmol/L — ABNORMAL LOW (ref 22–32)
CREATININE: 1.78 mg/dL — AB (ref 0.44–1.00)
Calcium: 6.6 mg/dL — ABNORMAL LOW (ref 8.9–10.3)
Chloride: 105 mmol/L (ref 101–111)
GFR calc Af Amer: 37 mL/min — ABNORMAL LOW (ref >60)
GFR, EST NON AFRICAN AMERICAN: 32 mL/min — AB (ref >60)
Glucose, Bld: 103 mg/dL — ABNORMAL HIGH (ref 65–99)
Potassium: 5.6 mmol/L — ABNORMAL HIGH (ref 3.5–5.1)
SODIUM: 134 mmol/L — AB (ref 135–145)

## 2017-06-26 LAB — CBC
HCT: 27.9 % — ABNORMAL LOW (ref 36.0–46.0)
HCT: 27.6 % — ABNORMAL LOW (ref 36.0–46.0)
HEMATOCRIT: 26.2 % — AB (ref 36.0–46.0)
HEMATOCRIT: 27.8 % — AB (ref 36.0–46.0)
HEMOGLOBIN: 8.7 g/dL — AB (ref 12.0–15.0)
Hemoglobin: 9 g/dL — ABNORMAL LOW (ref 12.0–15.0)
Hemoglobin: 8.2 g/dL — ABNORMAL LOW (ref 12.0–15.0)
Hemoglobin: 8.9 g/dL — ABNORMAL LOW (ref 12.0–15.0)
MCH: 32.1 pg (ref 26.0–34.0)
MCH: 31.3 pg (ref 26.0–34.0)
MCH: 31.6 pg (ref 26.0–34.0)
MCH: 32.4 pg (ref 26.0–34.0)
MCHC: 32.3 g/dL (ref 30.0–36.0)
MCHC: 31.3 g/dL (ref 30.0–36.0)
MCHC: 31.5 g/dL (ref 30.0–36.0)
MCHC: 32 g/dL (ref 30.0–36.0)
MCV: 99.6 fL (ref 78.0–100.0)
MCV: 100 fL (ref 78.0–100.0)
MCV: 100.4 fL — ABNORMAL HIGH (ref 78.0–100.0)
MCV: 101.1 fL — ABNORMAL HIGH (ref 78.0–100.0)
PLATELETS: 144 10*3/uL — AB (ref 150–400)
PLATELETS: 94 10*3/uL — AB (ref 150–400)
Platelets: 126 10*3/uL — ABNORMAL LOW (ref 150–400)
Platelets: 145 10*3/uL — ABNORMAL LOW (ref 150–400)
RBC: 2.8 MIL/uL — ABNORMAL LOW (ref 3.87–5.11)
RBC: 2.62 MIL/uL — AB (ref 3.87–5.11)
RBC: 2.75 MIL/uL — AB (ref 3.87–5.11)
RBC: 2.75 MIL/uL — AB (ref 3.87–5.11)
RDW: 19.8 % — ABNORMAL HIGH (ref 11.5–15.5)
RDW: 19.5 % — AB (ref 11.5–15.5)
RDW: 19.4 % — ABNORMAL HIGH (ref 11.5–15.5)
RDW: 19.8 % — ABNORMAL HIGH (ref 11.5–15.5)
WBC: 29.7 10*3/uL — ABNORMAL HIGH (ref 4.0–10.5)
WBC: 26 10*3/uL — AB (ref 4.0–10.5)
WBC: 29.5 10*3/uL — ABNORMAL HIGH (ref 4.0–10.5)
WBC: 30.2 10*3/uL — AB (ref 4.0–10.5)

## 2017-06-26 LAB — PROTIME-INR
INR: 2.36
Prothrombin Time: 25.6 seconds — ABNORMAL HIGH (ref 11.4–15.2)

## 2017-06-26 LAB — POCT I-STAT 7, (LYTES, BLD GAS, ICA,H+H)
ACID-BASE DEFICIT: 5 mmol/L — AB (ref 0.0–2.0)
Bicarbonate: 22.4 mmol/L (ref 20.0–28.0)
Calcium, Ion: 0.94 mmol/L — ABNORMAL LOW (ref 1.15–1.40)
HEMATOCRIT: 31 % — AB (ref 36.0–46.0)
HEMOGLOBIN: 10.5 g/dL — AB (ref 12.0–15.0)
O2 Saturation: 100 %
PH ART: 7.252 — AB (ref 7.350–7.450)
PO2 ART: 282 mmHg — AB (ref 83.0–108.0)
POTASSIUM: 5.3 mmol/L — AB (ref 3.5–5.1)
SODIUM: 137 mmol/L (ref 135–145)
TCO2: 24 mmol/L (ref 22–32)
pCO2 arterial: 50.9 mmHg — ABNORMAL HIGH (ref 32.0–48.0)

## 2017-06-26 LAB — BPAM PLATELET PHERESIS
BLOOD PRODUCT EXPIRATION DATE: 201903122359
Blood Product Expiration Date: 201903102359
Blood Product Expiration Date: 201903122359
ISSUE DATE / TIME: 201903102329
ISSUE DATE / TIME: 201903102334
ISSUE DATE / TIME: 201903110821
UNIT TYPE AND RH: 5100
UNIT TYPE AND RH: 7300
Unit Type and Rh: 7300

## 2017-06-26 LAB — PREPARE FRESH FROZEN PLASMA
UNIT DIVISION: 0
Unit division: 0

## 2017-06-26 LAB — GLUCOSE, CAPILLARY
GLUCOSE-CAPILLARY: 102 mg/dL — AB (ref 65–99)
GLUCOSE-CAPILLARY: 112 mg/dL — AB (ref 65–99)
GLUCOSE-CAPILLARY: 110 mg/dL — AB (ref 65–99)
Glucose-Capillary: 94 mg/dL (ref 65–99)
Glucose-Capillary: 110 mg/dL — ABNORMAL HIGH (ref 65–99)
Glucose-Capillary: 109 mg/dL — ABNORMAL HIGH (ref 65–99)

## 2017-06-26 LAB — CK
CK TOTAL: 1118 U/L — AB (ref 38–234)
Total CK: 910 U/L — ABNORMAL HIGH (ref 38–234)
Total CK: 1032 U/L — ABNORMAL HIGH (ref 38–234)

## 2017-06-26 LAB — FIBRINOGEN: Fibrinogen: 220 mg/dL (ref 210–475)

## 2017-06-26 LAB — HEPATIC FUNCTION PANEL
ALBUMIN: 1.9 g/dL — AB (ref 3.5–5.0)
ALT: 35 U/L (ref 14–54)
AST: 90 U/L — AB (ref 15–41)
Alkaline Phosphatase: 164 U/L — ABNORMAL HIGH (ref 38–126)
BILIRUBIN TOTAL: 11.4 mg/dL — AB (ref 0.3–1.2)
Bilirubin, Direct: 6.4 mg/dL — ABNORMAL HIGH (ref 0.1–0.5)
Indirect Bilirubin: 5 mg/dL — ABNORMAL HIGH (ref 0.3–0.9)
Total Protein: 5.7 g/dL — ABNORMAL LOW (ref 6.5–8.1)

## 2017-06-26 LAB — ECHOCARDIOGRAM COMPLETE
Height: 64 in
Weight: 3037.06 oz

## 2017-06-26 LAB — CORTISOL: CORTISOL PLASMA: 20.6 ug/dL

## 2017-06-26 LAB — BPAM FFP
Blood Product Expiration Date: 201903152359
Blood Product Expiration Date: 201903152359
ISSUE DATE / TIME: 201903102329
ISSUE DATE / TIME: 201903102329
UNIT TYPE AND RH: 5100
Unit Type and Rh: 5100

## 2017-06-26 LAB — MAGNESIUM: MAGNESIUM: 2 mg/dL (ref 1.7–2.4)

## 2017-06-26 LAB — PROCALCITONIN: Procalcitonin: 1.48 ng/mL

## 2017-06-26 LAB — LACTIC ACID, PLASMA
Lactic Acid, Venous: 2.6 mmol/L (ref 0.5–1.9)
Lactic Acid, Venous: 2.4 mmol/L (ref 0.5–1.9)

## 2017-06-26 MED ORDER — PIPERACILLIN-TAZOBACTAM 3.375 G IVPB
3.3750 g | Freq: Three times a day (TID) | INTRAVENOUS | Status: DC
  Filled 2017-06-26: qty 50

## 2017-06-26 MED ORDER — PIPERACILLIN-TAZOBACTAM 3.375 G IVPB 30 MIN
3.3750 g | Freq: Once | INTRAVENOUS | Status: AC
  Administered 2017-06-26: 3.375 g via INTRAVENOUS
  Filled 2017-06-26: qty 50

## 2017-06-26 MED ORDER — SODIUM CHLORIDE 0.9 % IV SOLN
0.0000 ug/kg/h | INTRAVENOUS | Status: DC
  Administered 2017-06-26: 0.3 ug/kg/h via INTRAVENOUS
  Filled 2017-06-26: qty 2

## 2017-06-26 MED ORDER — METOCLOPRAMIDE HCL 5 MG/ML IJ SOLN
10.0000 mg | Freq: Once | INTRAMUSCULAR | Status: AC
  Administered 2017-06-26: 10 mg via INTRAVENOUS
  Filled 2017-06-26: qty 2

## 2017-06-26 MED ORDER — LORAZEPAM 2 MG/ML IJ SOLN: 1.0000 mg | INTRAMUSCULAR | Status: DC | PRN

## 2017-06-26 MED ORDER — SODIUM CHLORIDE 0.9 % IV SOLN
INTRAVENOUS | Status: DC | PRN
  Administered 2017-06-25: 500 mL

## 2017-06-26 MED ORDER — HYDROCORTISONE NA SUCCINATE PF 100 MG IJ SOLR
50.0000 mg | Freq: Four times a day (QID) | INTRAMUSCULAR | Status: DC
  Administered 2017-06-26: 50 mg via INTRAVENOUS
  Filled 2017-06-26: qty 2
  Filled 2017-06-26: qty 1

## 2017-06-26 MED ORDER — SODIUM CHLORIDE 0.9% FLUSH: 10.0000 mL | INTRAVENOUS | Status: DC | PRN

## 2017-06-26 MED ORDER — SODIUM CHLORIDE 0.9% FLUSH
10.0000 mL | Freq: Two times a day (BID) | INTRAVENOUS | Status: DC
  Administered 2017-06-26 – 2017-06-29 (×7): 10 mL

## 2017-06-26 MED ORDER — SODIUM CHLORIDE 0.9 % IV BOLUS (SEPSIS)
1000.0000 mL | Freq: Once | INTRAVENOUS | Status: AC
  Administered 2017-06-26: 1000 mL via INTRAVENOUS

## 2017-06-26 MED ORDER — ORAL CARE MOUTH RINSE
15.0000 mL | Freq: Four times a day (QID) | OROMUCOSAL | Status: DC
  Administered 2017-06-26 – 2017-06-28 (×10): 15 mL via OROMUCOSAL

## 2017-06-26 MED ORDER — VANCOMYCIN HCL 10 G IV SOLR
1500.0000 mg | Freq: Once | INTRAVENOUS | Status: AC
  Administered 2017-06-26: 1500 mg via INTRAVENOUS
  Filled 2017-06-26: qty 1500

## 2017-06-26 MED ORDER — CHLORHEXIDINE GLUCONATE 0.12% ORAL RINSE (MEDLINE KIT)
15.0000 mL | Freq: Two times a day (BID) | OROMUCOSAL | Status: DC
  Administered 2017-06-26 – 2017-06-29 (×8): 15 mL via OROMUCOSAL

## 2017-06-26 MED ORDER — PIPERACILLIN-TAZOBACTAM IN DEX 2-0.25 GM/50ML IV SOLN
2.2500 g | Freq: Four times a day (QID) | INTRAVENOUS | Status: DC
  Administered 2017-06-26 – 2017-06-29 (×12): 2.25 g via INTRAVENOUS
  Filled 2017-06-26 (×14): qty 50

## 2017-06-26 MED ORDER — MIDAZOLAM HCL 2 MG/2ML IJ SOLN
INTRAMUSCULAR | Status: AC
  Filled 2017-06-26: qty 2

## 2017-06-26 MED ORDER — DEXMEDETOMIDINE HCL IN NACL 400 MCG/100ML IV SOLN
0.0000 ug/kg/h | INTRAVENOUS | Status: DC
  Administered 2017-06-26 – 2017-06-27 (×2): 0.3 ug/kg/h via INTRAVENOUS
  Filled 2017-06-26 (×2): qty 100

## 2017-06-26 MED ORDER — ADULT MULTIVITAMIN LIQUID CH: 15.0000 mL | Freq: Every day | ORAL | Status: DC

## 2017-06-26 MED ORDER — PROTAMINE SULFATE 10 MG/ML IV SOLN
INTRAVENOUS | Status: DC | PRN
  Administered 2017-06-26: 10 mg via INTRAVENOUS
  Administered 2017-06-26: 40 mg via INTRAVENOUS

## 2017-06-26 MED ORDER — SODIUM BICARBONATE 8.4 % IV SOLN
INTRAVENOUS | Status: DC | PRN
  Administered 2017-06-25: 50 meq via INTRAVENOUS

## 2017-06-26 MED ORDER — VITAMIN K1 10 MG/ML IJ SOLN
5.0000 mg | Freq: Once | INTRAMUSCULAR | Status: AC
  Administered 2017-06-26: 5 mg via INTRAVENOUS
  Filled 2017-06-26: qty 0.5

## 2017-06-26 MED ORDER — HEPARIN SODIUM (PORCINE) 1000 UNIT/ML IJ SOLN
INTRAMUSCULAR | Status: AC
  Filled 2017-06-26: qty 1

## 2017-06-26 MED ORDER — PROTAMINE SULFATE 10 MG/ML IV SOLN
INTRAVENOUS | Status: AC
  Filled 2017-06-26: qty 25

## 2017-06-26 MED ORDER — CHLORHEXIDINE GLUCONATE CLOTH 2 % EX PADS
6.0000 | MEDICATED_PAD | Freq: Every day | CUTANEOUS | Status: DC
  Administered 2017-06-26 – 2017-06-29 (×2): 6 via TOPICAL

## 2017-06-26 MED ORDER — VANCOMYCIN HCL IN DEXTROSE 1-5 GM/200ML-% IV SOLN: 1000.0000 mg | Freq: Two times a day (BID) | INTRAVENOUS | Status: DC

## 2017-06-26 MED ORDER — HEPARIN SODIUM (PORCINE) 1000 UNIT/ML IJ SOLN
INTRAMUSCULAR | Status: DC | PRN
  Administered 2017-06-26: 8000 [IU] via INTRAVENOUS

## 2017-06-26 NOTE — Op Note (Signed)
    OPERATIVE REPORT  DATE OF SURGERY: 06/26/2017  PATIENT: Whitney Mathis, 52 y.o. female MRN: [id number]  DOB: [birth year]  PRE-OPERATIVE DIAGNOSIS: Left leg ischemia  POST-OPERATIVE DIAGNOSIS:  Same  PROCEDURE: Left femoral embolectomy and 4 compartment fasciotomy  SURGEON:  Gretta Beganodd Arthelia Callicott, M.D.  PHYSICIAN ASSISTANT: Lianne CureMaureen Collins PA-C  ANESTHESIA: General  EBL: 75 ml  Total I/O In: 469 [Blood:469] Out: 105 [Urine:30; Blood:75]  BLOOD ADMINISTERED: Platelets and FFP per anesthesia record  DRAINS: None  SPECIMEN: Femoral embolus  COUNTS CORRECT:  YES  PLAN OF CARE: Medical intensive care unit  PATIENT DISPOSITION:  PACU - hemodynamically stable  PROCEDURE DETAILS: Patient was taken emergently to the operating room intubated.  The left groin and left leg were prepped and draped in usual sterile fashion.  An incision was made over the femoral pulse and carried down to isolate the common femoral artery at the inguinal ligament.  There was a normal pulse in the common femoral artery.  There was obvious occlusion of the junction of the superficial femoral and profundus femoris arteries.  These were individually isolated with blue Vesseloops.  The patient was given 8000 units of intravenous heparin after adequate circulation time the external iliac artery was occluded at the inguinal ligament and the common femoral artery was opened transversely.  There was a thick rubbery tannish organized thrombus present and this was removed.  Fogarty passed the catheter was passed into the level of the aorta and no clot was noted proximally with excellent inflow.  This was reoccluded.  Next a Fogarty catheter was passed all the way to the ankle and was withdrawn.  There was some organized thrombus in the groin and some fresh thrombus just the most proximal superficial femoral artery.  There was good backbleeding.  This was reoccluded.  Finally the deep femoral artery was thrombectomized and there  was some organized embolus at the origin with good backbleeding.  The incision in the common femoral artery was closed with a running 6-0 Prolene suture.  After the usual flushing maneuvers the anastomosis was completed and clamps were removed.  There was a good Doppler flow at the dorsalis pedis and posterior tibial.  The patient had no audible signals preoperatively.  Due to the prolonged ischemia time and critical illness with inability to follow physical exam I elected to proceed with 4 compartment fasciotomy.  She had incisions on the lateral and medial aspect of her calf and all 4 compartments were released from the need to the ankle.  The wounds were irrigated with saline and the fasciotomy compartments had VAC dressings placed.  The groin wound was irrigated and hemostasis was obtained with electrocautery.  The groin wound was closed with 2 layers of 2-0 Vicryl in the subcutaneous fat and the skin was closed with 3-0 subcuticular Vicryl stitch.  A sterile dressing was applied and the patient was transferred to the intensive care unit and a stable condition   Larina Earthlyodd F. Jerimah Witucki, M.D., [facility] HospitalFACS 06/26/2017 12:51 AM

## 2017-06-26 NOTE — OR Nursing (Signed)
Patient silver solid band wrap and silver color ring with clear stones given to DeeDee RN in 2100.

## 2017-06-26 NOTE — Consult Note (Signed)
Referring Provider:  Dr. Jimmey Ralph  Primary Care Physician:  [facility], [facility] Personal Primary Gastroenterologist:  Patient is followed at [facility]  Reason for Consultation:  GI bleed, history of cirrhosis  HPI: Talar Fraley is a 52 y.o. female with past medical history of decompensated alcoholic cirrhosis with ongoing alcohol use, history of ascites was transferred to [facility] from [facility] for further evaluation of left lower Ext weakness/ischemia. She was also found to have evidence of GI bleed. GI is consulted for further evaluation. Patient underwent left femoral embolectomy and 4 compartment fasciotomy early this morning.  Patient seen and examined at bedside in ICU. Husband at bedside. Husband denies any evidence of bleeding prior to coming to the hospital yesterday. She had small amount of streaks of blood in the vomiting. Denied any black tarry stool. Denied any bright red blood per rectum. Discussed with the nursing staff. No reported melena during her hospitalization here.  Patient  was last seen at [facility] on 06/14/2017. She was not deemed to be candidate for liver transplant unless she is sober for at least 6 months. They had recommended EGD for variceal screening at that time. CT abdomen pelvis without contrast yesterday showed cirrhosis associated with upper abdominal varices and small amount of ascites.   Past Medical History:  Diagnosis Date  . Alcohol dependence ([place])    "stopped drinking in 09/19/16" - Update: continues to consume etoh (05/2017 [facility] note)  . Chronic back pain   . Chronic pain   . Cirrhosis ([place])   . Jaundice   . Lumbar radiculopathy   . Tobacco use     Past Surgical History:  Procedure Laterality Date  . JOINT REPLACEMENT Right    hip  . LEG SURGERY     right  . TONSILLECTOMY      Prior to Admission medications   Medication Sig Start Date End Date Taking? Authorizing Provider  acetaminophen (TYLENOL) 325 MG tablet Take 650  mg by mouth every 6 (six) hours as needed for moderate pain.   Yes [provider]  azelastine (OPTIVAR) 0.05 % ophthalmic solution Place 1 drop into both eyes 2 (two) times daily. 01/13/17  Yes [provider]  diclofenac sodium (VOLTAREN) 1 % GEL Apply 1 application topically at bedtime. 06/20/17  Yes [provider]  furosemide (LASIX) 40 MG tablet Take 40 mg by mouth daily.  12/01/16  Yes [provider]  gabapentin (NEURONTIN) 800 MG tablet Take 1 tablet by mouth 3 (three) times daily as needed for pain. 06/02/17  Yes [provider]  meloxicam (MOBIC) 15 MG tablet Take 1 tablet by mouth daily. 06/02/17  Yes [provider]  methocarbamol (ROBAXIN) 500 MG tablet Take 2 tablets by mouth at bedtime. 04/19/17  Yes [provider]  montelukast (SINGULAIR) 10 MG tablet Take 1 tablet by mouth daily. 05/17/17  Yes [provider]  spironolactone (ALDACTONE) 100 MG tablet Take 1 tablet by mouth daily. 06/17/17  Yes [provider]    Scheduled Meds: . chlorhexidine gluconate (MEDLINE KIT)  15 mL Mouth Rinse BID  . fentaNYL (SUBLIMAZE) injection  50 mcg Intravenous Once  . folic acid  1 mg Intravenous Daily  . hydrocortisone sod succinate (SOLU-CORTEF) inj  50 mg Intravenous Q6H  . ketamine (KETALAR) injection 59m/mL (IV use)  1 mg/kg Intravenous Once  . mouth rinse  15 mL Mouth Rinse QID  . thiamine  100 mg Oral Daily   Or  . thiamine  100 mg Intravenous Daily   Continuous Infusions: . sodium chloride    . sodium chloride 75 mL/hr at 06/26/17 0800  . dexmedetomidine (PRECEDEX) IV infusion    . fentaNYL infusion INTRAVENOUS 100 mcg/hr (06/26/17 0826)  . octreotide  (SANDOSTATIN)    IV infusion 50 mcg/hr (06/26/17 0800)  . pantoprozole (PROTONIX) infusion 8 mg/hr (06/26/17 0800)  . phenylephrine (NEO-SYNEPHRINE) Adult infusion 60 mcg/min (06/26/17 0926)  . piperacillin-tazobactam (ZOSYN)  IV    . vasopressin (PITRESSIN)  infusion - *FOR SHOCK* 0.03 Units/min (06/26/17 0800)   PRN Meds:.sodium chloride, fentaNYL, LORazepam, ondansetron (ZOFRAN) IV, phenylephrine  Allergies as of 07/04/2017 - Review Complete 07/14/2017  Allergen Reaction Noted  . Codeine Nausea And Vomiting 09/08/2016    Family History  Problem Relation Age of Onset  . Bone cancer Mother 93    Social History   Socioeconomic History  . Marital status: Married    Spouse name: Not on file  . Number of children: Not on file  . Years of education: Not on file  . Highest education level: Not on file  Social Needs  . Financial resource strain: Not on file  . Food insecurity - worry: Not on file  . Food insecurity - inability: Not on file  . Transportation needs - medical: Not on file  . Transportation needs - non-medical: Not on file  Occupational History  . Occupation: housewife  Tobacco Use  . Smoking status: Current Every Day Smoker    Packs/day: 0.25    Types: Cigarettes    Start date: 82  . Smokeless tobacco: Never Used  Substance and Sexual Activity  . Alcohol use: Yes    Frequency: Never  . Drug use: No  . Sexual activity: Not on file  Other Topics Concern  . Not on file  Social History Narrative  . Not on file    Review of Systems: Not able to obtain as patient is intubated.  Physical Exam: Vital signs: Vitals:   06/26/17 0801 06/26/17 0900  BP: (!) 84/53 (!) 82/55  Pulse: 94 96  Resp: 20 15  Temp:    SpO2: 98% 100%   Last BM Date: 06/23/2017 General:   Intubated and sedated. Head - NS, AT  Oral cavity - not able to examine. Currently intubated Lungs:  Coarse breath sounds bilaterally Heart:  Regular rate and rhythm; no murmurs, clicks, rubs,  or gallops. Abdomen: Distended abdomen. Bowel sounds present. Not able to proceed tenderness. No peritoneal signs. Lower ext : Dressing over left lower extremity noted. Right lower extremity edema also noted. Psych - unable to evaluate Neuro - unable to  evaluate GI:  Lab Results: Recent Labs    07/04/2017 1203  06/23/2017 2103 07/15/2017 2138 07/16/2017 2343 06/26/17 0515  WBC 25.1*  --  22.0*  --   --  29.7*  HGB 9.9*   < > 8.5* 8.8* 10.5* 9.0*  HCT 30.1*   < > 25.9* 26.0* 31.0* 27.9*  PLT 67*  --  53*  --   --  144*   < > = values in this interval not displayed.   BMET Recent Labs    06/29/2017 1203  06/16/2017 2103 07/10/2017 2138 07/10/2017 2343 06/26/17 0614  NA 130*   < > 133* 137 137 134*  K 4.3   < > 5.1 5.0 5.3* 5.6*  CL 97*   < > 105 102  --  105  CO2 17*  --  18*  --   --  18*  GLUCOSE 101*   < > 93 94  --  103*  BUN 22*   < > 23* 22*  --  27*  CREATININE 0.84   < > 1.20* 1.10*  --  1.78*  CALCIUM 7.9*  --  6.8*  --   --  6.6*   < > = values in this interval not displayed.   LFT Recent Labs    06/26/17 0614  PROT 5.7*  ALBUMIN 1.9*  AST 90*  ALT 35  ALKPHOS 164*  BILITOT 11.4*  BILIDIR 6.4*  IBILI 5.0*   PT/INR Recent Labs    07/07/2017 1203 06/26/17 0614  LABPROT 22.1* 25.6*  INR 1.96 2.36     Studies/Results: Ct Abdomen Pelvis Wo Contrast  Result Date: 07/14/2017 CLINICAL DATA:  Low back pain radiating into left leg. Left leg numbness. Back pain for 3 weeks, new leg symptoms. Denies incontinence of stool or urine. History of chronic back pain and hip surgery. EXAM: CT ABDOMEN AND PELVIS WITHOUT CONTRAST CT LUMBAR SPINE WITHOUT CONTRAST TECHNIQUE: Multidetector CT imaging of the abdomen and pelvis was performed following the standard protocol without IV contrast. COMPARISON:  None. FINDINGS: CHEST CT: Lower chest: Masslike consolidation within the lateral aspects of the right lower lobe, abutting the pleura, measuring 4.7 x 1.7 cm, most likely atelectasis. Additional probable atelectasis at the left lung base. Hepatobiliary: Cirrhotic appearing liver. Single gallstone measuring approximately 6 mm. Mild gallbladder wall thickening which is likely related to the adjacent liver disease and/or ascites. No bile duct  dilatation. Pancreas: Unremarkable. No pancreatic ductal dilatation or surrounding inflammatory changes. Spleen: Splenomegaly. Adrenals/Urinary Tract: Adrenal glands appear normal. Kidneys are unremarkable without mass, stone or hydronephrosis. No ureteral or bladder calculi identified. Bladder is decompressed. Stomach/Bowel: Bowel is normal in caliber. No bowel wall thickening or evidence of bowel wall inflammation appreciated, although characterization of some portions of the bowel is limited by patient motion artifact. Vascular/Lymphatic: Aortic atherosclerosis. Large varices about the splenic hilum. Abnormally prominent caliber of the left renal vein, of uncertain chronicity, possibly also related to the presumed portal venous hypertension. No enlarged lymph nodes seen in the abdomen or pelvis. Reproductive: Uterus and bilateral adnexa are unremarkable. Other: Small amount of ascites within the abdomen and pelvis. No abscess collection seen. No free intraperitoneal air. Musculoskeletal: No acute or suspicious osseous finding. Right hip arthroplasty hardware appears appropriately positioned. Ill-defined edema within the subcutaneous soft tissues of the abdomen and pelvis indicating some degree of anasarca. LUMBAR SPINE CT: Mild dextroscoliosis centered at the L3 vertebral body level. Alignment appears otherwise normal. No evidence of acute vertebral body subluxation. No fracture line or displaced fracture fragment. No acute or suspicious osseous lesion. Mild disc desiccations at the L2-3 and L3-4 levels, with associated mild disc space narrowings. Mild diffuse disc bulge at L5-S1, with probable mild neural foramen encroachment bilaterally. No large disc bulge or protrusion at any level. No significant central canal stenosis at any level. The immediate paravertebral soft tissues are unremarkable. IMPRESSION: 1. Cirrhotic liver. Associated splenomegaly and upper abdominal varices. Associated small amount of ascites  in the abdomen and pelvis. 2. Cholelithiasis without evidence of acute cholecystitis. 3. Masslike consolidation within the right lower lung, lateral aspects of the right lower lobe abutting the pleura, measuring 4.7 cm greatest dimension, most likely atelectasis. Recommend follow-up chest CT at some point to ensure resolution. 4. No acute findings within the lumbar spine. Mild scoliosis. Mild degenerative change within the lumbar spine, as detailed above. No  large disc bulge or protrusion seen. 5. Anasarca. 6. Aortic atherosclerosis. Electronically Signed   By: Franki Cabot M.D.   On: 07/10/2017 13:28   Dg Chest 2 View  Result Date: 06/24/2017 CLINICAL DATA:  Short of breath. Wheezing. Cirrhosis. Alcohol dependence. EXAM: CHEST - 2 VIEW COMPARISON:  09/08/2016 FINDINGS: Midline trachea. Cardiomegaly, accentuated by AP portable frontal technique. Small right pleural effusion versus pleural thickening, blunting the right costophrenic angle. No pneumothorax. Moderate pulmonary interstitial prominence and indistinctness. No lobar consolidation. IMPRESSION: Cardiomegaly with moderate interstitial edema. Small right pleural effusion versus pleural thickening. Electronically Signed   By: Abigail Miyamoto M.D.   On: 06/20/2017 13:05   US Abdomen Complete  Result Date: 06/26/2017 CLINICAL DATA:  Hepatic cirrhosis with abdominal distension EXAM: ABDOMEN ULTRASOUND COMPLETE COMPARISON:  CT abdomen and pelvis June 25, 2017 FINDINGS: Gallbladder: Within the gallbladder, there are echogenic foci which move and shadow consistent with cholelithiasis. Largest gallstone measures 1.0 cm in length. Sludge is also noted in the gallbladder. The gallbladder wall is mildly thickened without edema or pericholecystic fluid. No sonographic Murphy sign noted by sonographer. Common bile duct: Diameter: 5 mm. No intrahepatic, common hepatic, or common bile dilatation. Liver: No focal lesion identified. Liver has a nodular contour with an  overall increase in echogenicity. Portal vein is patent on color Doppler imaging with normal direction of blood flow towards the liver. IVC: No abnormality visualized. Pancreas: Visualized portion unremarkable. Much of the pancreas is obscured by gas. Spleen: Spleen measures 13.0 x 13.7 x 8.3 cm with a measured splenic volume of 769 cubic cm. No focal splenic lesions are evident. Note that there are varices in the upper abdomen on the left. Right Kidney: Length: 12.4 cm. Echogenicity within normal limits. No mass or hydronephrosis visualized. Left Kidney: Length: 12.9 cm. Echogenicity within normal limits. No mass or hydronephrosis visualized. Abdominal aorta: No aneurysm visualized. Other findings: There is fairly mild ascites. There are small pleural effusions bilaterally. IMPRESSION: 1. Cholelithiasis with mild gallbladder wall thickening. The gallbladder wall thickening may be due to underlying ascites. However, early acute cholecystitis must be of concern in this circumstance. This finding may warrant nuclear medicine hepatobiliary imaging study to assess for cystic duct patency. 2. The appearance of the liver is indicative of hepatic cirrhosis. While no focal liver lesions are evident on this study, it must be cautioned that the sensitivity of ultrasound for detection of focal liver lesions is diminished in this circumstance. 3.  Splenomegaly with perisplenic varices. 4.  Mild ascites.  Small pleural effusions evident bilaterally. 5.  Pancreas largely obscured by gas. Electronically Signed   By: Lowella Grip III M.D.   On: 06/26/2017 07:25   Ct L-spine No Charge  Result Date: 07/09/2017 CLINICAL DATA:  Low back pain radiating into left leg. Left leg numbness. Back pain for 3 weeks, new leg symptoms. Denies incontinence of stool or urine. History of chronic back pain and hip surgery. EXAM: CT ABDOMEN AND PELVIS WITHOUT CONTRAST CT LUMBAR SPINE WITHOUT CONTRAST TECHNIQUE: Multidetector CT imaging of the  abdomen and pelvis was performed following the standard protocol without IV contrast. COMPARISON:  None. FINDINGS: CHEST CT: Lower chest: Masslike consolidation within the lateral aspects of the right lower lobe, abutting the pleura, measuring 4.7 x 1.7 cm, most likely atelectasis. Additional probable atelectasis at the left lung base. Hepatobiliary: Cirrhotic appearing liver. Single gallstone measuring approximately 6 mm. Mild gallbladder wall thickening which is likely related to the adjacent liver disease and/or ascites. No  bile duct dilatation. Pancreas: Unremarkable. No pancreatic ductal dilatation or surrounding inflammatory changes. Spleen: Splenomegaly. Adrenals/Urinary Tract: Adrenal glands appear normal. Kidneys are unremarkable without mass, stone or hydronephrosis. No ureteral or bladder calculi identified. Bladder is decompressed. Stomach/Bowel: Bowel is normal in caliber. No bowel wall thickening or evidence of bowel wall inflammation appreciated, although characterization of some portions of the bowel is limited by patient motion artifact. Vascular/Lymphatic: Aortic atherosclerosis. Large varices about the splenic hilum. Abnormally prominent caliber of the left renal vein, of uncertain chronicity, possibly also related to the presumed portal venous hypertension. No enlarged lymph nodes seen in the abdomen or pelvis. Reproductive: Uterus and bilateral adnexa are unremarkable. Other: Small amount of ascites within the abdomen and pelvis. No abscess collection seen. No free intraperitoneal air. Musculoskeletal: No acute or suspicious osseous finding. Right hip arthroplasty hardware appears appropriately positioned. Ill-defined edema within the subcutaneous soft tissues of the abdomen and pelvis indicating some degree of anasarca. LUMBAR SPINE CT: Mild dextroscoliosis centered at the L3 vertebral body level. Alignment appears otherwise normal. No evidence of acute vertebral body subluxation. No fracture  line or displaced fracture fragment. No acute or suspicious osseous lesion. Mild disc desiccations at the L2-3 and L3-4 levels, with associated mild disc space narrowings. Mild diffuse disc bulge at L5-S1, with probable mild neural foramen encroachment bilaterally. No large disc bulge or protrusion at any level. No significant central canal stenosis at any level. The immediate paravertebral soft tissues are unremarkable. IMPRESSION: 1. Cirrhotic liver. Associated splenomegaly and upper abdominal varices. Associated small amount of ascites in the abdomen and pelvis. 2. Cholelithiasis without evidence of acute cholecystitis. 3. Masslike consolidation within the right lower lung, lateral aspects of the right lower lobe abutting the pleura, measuring 4.7 cm greatest dimension, most likely atelectasis. Recommend follow-up chest CT at some point to ensure resolution. 4. No acute findings within the lumbar spine. Mild scoliosis. Mild degenerative change within the lumbar spine, as detailed above. No large disc bulge or protrusion seen. 5. Anasarca. 6. Aortic atherosclerosis. Electronically Signed   By: Franki Cabot M.D.   On: 07/15/2017 13:28   Dg Chest Port 1 View  Result Date: 07/05/2017 CLINICAL DATA:  Central line placement. EXAM: PORTABLE CHEST 1 VIEW COMPARISON:  Chest radiograph performed earlier today at 9:18 p.m. FINDINGS: The patient's right IJ line is noted ending about the proximal SVC. The endotracheal tube is seen ending 4 cm above the carina. Lung expansion is mildly improved. Vascular congestion is noted. Increased interstitial markings likely reflect pulmonary edema. Small bilateral pleural effusions are suspected. No pneumothorax is seen. The cardiomediastinal silhouette is mildly enlarged. No acute osseous abnormalities are identified. IMPRESSION: 1. Right IJ line noted ending about the proximal SVC. 2. Endotracheal tube seen ending 4 cm above the carina. 3. Lung expansion is mildly improved.  Vascular congestion and mild cardiomegaly. Increased interstitial markings likely reflect pulmonary edema. Small bilateral pleural effusions suspected. Electronically Signed   By: Garald Balding M.D.   On: 07/15/2017 22:33   Dg Chest Port 1 View  Result Date: 07/14/2017 CLINICAL DATA:  Assess endotracheal tube position. EXAM: PORTABLE CHEST 1 VIEW COMPARISON:  Chest radiograph performed earlier today at 7:12 p.m. FINDINGS: The patient's endotracheal tube is seen ending 2-3 cm above the carina. The lungs are hypoexpanded. Patchy bilateral airspace opacities are improved from the prior study and may reflect mild pulmonary edema or pneumonia. No definite pleural effusion or pneumothorax is seen. The cardiomediastinal silhouette is borderline enlarged. No acute osseous  abnormalities are identified. IMPRESSION: 1. Endotracheal tube seen ending 2-3 cm above the carina. 2. Lungs remain hypoexpanded. Patchy bilateral airspace opacities are improved from the recent prior study and may reflect mild pulmonary edema or pneumonia. 3. Borderline cardiomegaly. Electronically Signed   By: Garald Balding M.D.   On: 07/01/2017 22:12   Dg Chest Portable 1 View  Result Date: 07/14/2017 CLINICAL DATA:  Status post intubation. EXAM: PORTABLE CHEST 1 VIEW COMPARISON:  07/12/2017 at 1634 hours FINDINGS: Endotracheal tube tip projects 3.8 cm above the carina. Bilateral airspace lung opacities appear mildly increased when compared to the earlier exam. IMPRESSION: 1. Endotracheal tube tip well positioned 3.8 cm above the carina. 2. Worsened pulmonary edema since the earlier study. Electronically Signed   By: Lajean Manes M.D.   On: 07/07/2017 19:36   Dg Chest Portable 1 View  Result Date: 06/26/2017 CLINICAL DATA:  Dyspnea and low back pain today. EXAM: PORTABLE CHEST 1 VIEW COMPARISON:  Chest x-ray from earlier same day. FINDINGS: Study is hypoinspiratory limiting comparison to the earlier exam. Heart size and mediastinal  contours are grossly stable. Again noted is diffuse bilateral interstitial edema, perhaps slightly progressed compared to the earlier exam. IMPRESSION: Cardiomegaly and bilateral pulmonary edema, perhaps slightly worsened compared to today's earlier exam, although the appearance of worsening may merely be due to the low lung volumes on this chest x-ray. Electronically Signed   By: Franki Cabot M.D.   On: 07/11/2017 16:55    Impression/Plan: - Decompensated alcoholic cirrhosis. Abdomen pelvis showed upper abdominal varices. Small amount of trace of blood noted in the vomiting yesterday. Discussed with the nursing staff. No reported melena today. Hemoglobin relatively stable. - Respiratory failure. Currently intubated - Septic shock. Currently on pressor support - Left lower extent ischemia. S/P embolectomy and 4 compartment fasciotomy early this morning.  Recommendations -------------------------- - No evidence of overt bleeding at this time. Hemoglobin relatively stable. INR trending up - Patient is hemodynamically unstable at this time although she is intubated for airway protection. - Discussed with critical care attending. Okay to give low-dose vitamin K.  - Monitor H&H. Continue PPI and octreotide. - Plan for EGD tomorrow. - This was discussed with patient's husband. He verbalized understanding.  Risks (bleeding, infection, bowel perforation that could require surgery, sedation-related changes in cardiopulmonary systems), benefits (identification and possible treatment of source of symptoms, exclusion of certain causes of symptoms), and alternatives (watchful waiting, radiographic imaging studies, empiric medical treatment)  were explained to family in detail and patient wishes to proceed.   LOS: 1 day   Otis Brace  MD, FACP 06/26/2017, 10:08 AM  Contact #  [phone]

## 2017-06-26 NOTE — Progress Notes (Signed)
  Echocardiogram 2D Echocardiogram has been performed.  Whitney Mathis 06/26/2017, 9:38 AM

## 2017-06-26 NOTE — Progress Notes (Signed)
PHARMACY - PHYSICIAN COMMUNICATION CRITICAL VALUE ALERT - BLOOD CULTURE IDENTIFICATION (BCID)  Whitney Mathis is an 52 y.o. female who [facility] with a chief complaint of septic shock  Assessment:  Pneumonia vs. SBP vs. UTI (include suspected source if known)  Name of physician (or Provider) Contacted: Dr. Delton CoombesByrum  Current antibiotics: Vancomycin and Zosyn   Changes to prescribed antibiotics recommended:  Patient is on recommended antibiotics - No changes needed  Results for orders placed or performed during the hospital encounter of 07/10/2017  Blood Culture ID Panel (Reflexed) (Collected: 06/20/2017  1:00 PM)  Result Value Ref Range   Enterococcus species NOT DETECTED NOT DETECTED   Listeria monocytogenes NOT DETECTED NOT DETECTED   Staphylococcus species DETECTED (A) NOT DETECTED   Staphylococcus aureus NOT DETECTED NOT DETECTED   Methicillin resistance NOT DETECTED NOT DETECTED   Streptococcus species NOT DETECTED NOT DETECTED   Streptococcus agalactiae NOT DETECTED NOT DETECTED   Streptococcus pneumoniae NOT DETECTED NOT DETECTED   Streptococcus pyogenes NOT DETECTED NOT DETECTED   Acinetobacter baumannii NOT DETECTED NOT DETECTED   Enterobacteriaceae species NOT DETECTED NOT DETECTED   Enterobacter cloacae complex NOT DETECTED NOT DETECTED   Escherichia coli NOT DETECTED NOT DETECTED   Klebsiella oxytoca NOT DETECTED NOT DETECTED   Klebsiella pneumoniae NOT DETECTED NOT DETECTED   Proteus species NOT DETECTED NOT DETECTED   Serratia marcescens NOT DETECTED NOT DETECTED   Haemophilus influenzae NOT DETECTED NOT DETECTED   Neisseria meningitidis NOT DETECTED NOT DETECTED   Pseudomonas aeruginosa NOT DETECTED NOT DETECTED   Candida albicans NOT DETECTED NOT DETECTED   Candida glabrata NOT DETECTED NOT DETECTED   Candida krusei NOT DETECTED NOT DETECTED   Candida parapsilosis NOT DETECTED NOT DETECTED   Candida tropicalis NOT DETECTED NOT DETECTED     Whitney Mathis, PharmD., BCPS Clinical Pharmacist Clinical phone for 06/26/17 until 3:30pm: [phone] If after 3:30pm, please call main pharmacy at: [phone]

## 2017-06-26 NOTE — Transfer of Care (Signed)
Immediate Anesthesia Transfer of Care Note  Patient: Whitney Mathis  Procedure(s) Performed: THROMBECTOMY LEFT FEMORAL ARTERY; FASCIOTOMY LEFT MEDIAL AND LATERAL LOWER LEG (Left Leg Upper)  Patient Location: ICU  Anesthesia Type:General  Level of Consciousness: sedated and Patient remains intubated per anesthesia plan  Airway & Oxygen Therapy: Patient remains intubated per anesthesia plan and Patient placed on Ventilator (see vital sign flow sheet for setting)  Post-op Assessment: Report given to RN and Post -op Vital signs reviewed and stable  Post vital signs: Reviewed and stable  Last Vitals:  Vitals:   07/03/2017 2310 07/09/2017 2315  BP: 97/69 101/73  Pulse: (!) 112 (!) 110  Resp: 11 19  Temp:    SpO2: 100% 100%    Last Pain:  Vitals:   06/24/2017 2245  TempSrc: Oral  PainSc:          Complications: No apparent anesthesia complications

## 2017-06-26 NOTE — Progress Notes (Signed)
CRITICAL VALUE ALERT  Critical Value:  Lactic acid 2.6  Date & Time Notied:  9:03 am   Provider Notified: Byrum   Orders Received/Actions taken: trending down   Montine CircleKim Suzy Kugel, RN

## 2017-06-26 NOTE — Anesthesia Postprocedure Evaluation (Signed)
Anesthesia Post Note  Patient: Whitney Mathis  Procedure(s) Performed: THROMBECTOMY LEFT FEMORAL ARTERY; FASCIOTOMY LEFT MEDIAL AND LATERAL LOWER LEG (Left Leg Upper)     Patient location during evaluation: ICU Anesthesia Type: General Level of consciousness: patient remains intubated per anesthesia plan Pain management: satisfactory to patient Vital Signs Assessment: post-procedure vital signs reviewed and stable Respiratory status: patient remains intubated per anesthesia plan Cardiovascular status: stable Anesthetic complications: no    Last Vitals:  Vitals:   06/16/2017 2310 07/04/2017 2315  BP: 97/69 101/73  Pulse: (!) 112 (!) 110  Resp: 11 19  Temp:    SpO2: 100% 100%    Last Pain:  Vitals:   07/05/2017 2245  TempSrc: Oral  PainSc:                  Whitney Mathis

## 2017-06-26 NOTE — Progress Notes (Signed)
PULMONARY / CRITICAL CARE MEDICINE   Name: Whitney Mathis MRN: [id number] DOB: [birth year]    ADMISSION DATE:  06/19/2017 CONSULTATION DATE:  07/15/2017  REFERRING MD:  Hyacinth Meeker   CHIEF COMPLAINT:  Left Leg Ischemia   HISTORY OF PRESENT ILLNESS:   52 year old female with PMH of ETOH, Liver Cirrhosis, Tobacco use  Presents to [facility] 3/10 with left lung numbness that began earlier this morning. Presents to ED with dyspnea, tachycardia, and melena. WBC 25.1. Found to have no pulses below the knee of left side. Vascular consulted. While in the ED patient went into respiratory distress requiring intubation Transferred to [facility] for further work-up.   Upon arrival to [facility] patient HR 150-160, Systolic 90-100. Vascular at bedside with plans to take patient emergently to OR. While in unit patient became hypotensive requiring central line placement and initiation of neo and vasopressin gtt.    SUBJECTIVE / Interval Events:  POD #1 status post left lower extremity femoral embolectomy and 4 compartment fasciotomy, apparent embolic thrombus removed Decreasing urine output, evidence for evolving acute renal failure Note positive blood cultures No anti-coag started so far given concern for upper GI bleeding  VITAL SIGNS: BP 90/66   Pulse 95   Temp 99 F (37.2 C) (Oral)   Resp 15   Ht 5\' 4"  (1.626 m)   Wt 86.1 kg (189 lb 13.1 oz)   SpO2 99%   BMI 32.58 kg/m   HEMODYNAMICS: CVP:  [16 mmHg-23 mmHg] 19 mmHg  VENTILATOR SETTINGS: Vent Mode: PRVC FiO2 (%):  [50 %-100 %] 50 % Set Rate:  [16 bmp-20 bmp] 20 bmp Vt Set:  [450 mL] 450 mL PEEP:  [5 cmH20] 5 cmH20 Plateau Pressure:  [21 cmH20-24 cmH20] 24 cmH20  INTAKE / OUTPUT: I/O last 3 completed shifts: In: 5343.3 [I.V.:2674.3; Blood:469; IV Piggyback:2200] Out: 105 [Urine:30; Blood:75]  PHYSICAL EXAMINATION: General: Ill-appearing woman, sedated, intubated Neuro: Does not wake to voice, does turn her head to stimulation,  grimace with pain, not following any commands HEENT: Endotracheal tube in place Cardiovascular: Regular, distant, no murmur Lungs: Bilateral inspiratory crackles, no wheezing Abdomen: Slightly distended, soft, positive bowel sounds Musculoskeletal: Left groin dressing clean and dry Skin:  Jaundice, intact    LABS:  BMET Recent Labs  Lab 06/20/2017 1203  07/10/2017 2103 06/26/2017 2138 06/27/2017 2343 06/26/17 0614  NA 130*   < > 133* 137 137 134*  K 4.3   < > 5.1 5.0 5.3* 5.6*  CL 97*   < > 105 102  --  105  CO2 17*  --  18*  --   --  18*  BUN 22*   < > 23* 22*  --  27*  CREATININE 0.84   < > 1.20* 1.10*  --  1.78*  GLUCOSE 101*   < > 93 94  --  103*   < > = values in this interval not displayed.    Electrolytes Recent Labs  Lab 07/04/2017 1203 07/14/2017 2103 06/26/17 0614  CALCIUM 7.9* 6.8* 6.6*  MG 1.0* 1.5* 2.0  PHOS  --  5.9* 6.8*    CBC Recent Labs  Lab 06/28/2017 1203  07/03/2017 2103 07/10/2017 2138 07/15/2017 2343 06/26/17 0515  WBC 25.1*  --  22.0*  --   --  29.7*  HGB 9.9*   < > 8.5* 8.8* 10.5* 9.0*  HCT 30.1*   < > 25.9* 26.0* 31.0* 27.9*  PLT 67*  --  53*  --   --  144*   < > = values in this interval not displayed.    Coag's Recent Labs  Lab 06/27/2017 1203 06/26/17 0614  INR 1.96 2.36    Sepsis Markers Recent Labs  Lab 06/24/2017 2103 06/26/17 0515 06/26/17 0754  LATICACIDVEN 3.1*  --  2.6*  PROCALCITON 1.33 1.48  --     ABG Recent Labs  Lab 06/28/2017 2343 06/26/17 0359 06/26/17 0758  PHART 7.252* 7.291* 7.316*  PCO2ART 50.9* 44.4 37.6  PO2ART 282.0* 187.0* 85.0    Liver Enzymes Recent Labs  Lab 06/28/2017 1203 06/26/17 0614  AST 76* 90*  ALT 31 35  ALKPHOS 168* 164*  BILITOT 11.1* 11.4*  ALBUMIN 1.9* 1.9*    Cardiac Enzymes Recent Labs  Lab 07/01/2017 2103 06/26/17 0515 06/26/17 0614  TROPONINI 0.98* 0.30* 0.31*    Glucose Recent Labs  Lab 07/13/2017 2049 06/26/17 0428 06/26/17 0757  GLUCAP 95 94 102*    Imaging Ct  Abdomen Pelvis Wo Contrast  Result Date: 07/14/2017 CLINICAL DATA:  Low back pain radiating into left leg. Left leg numbness. Back pain for 3 weeks, new leg symptoms. Denies incontinence of stool or urine. History of chronic back pain and hip surgery. EXAM: CT ABDOMEN AND PELVIS WITHOUT CONTRAST CT LUMBAR SPINE WITHOUT CONTRAST TECHNIQUE: Multidetector CT imaging of the abdomen and pelvis was performed following the standard protocol without IV contrast. COMPARISON:  None. FINDINGS: CHEST CT: Lower chest: Masslike consolidation within the lateral aspects of the right lower lobe, abutting the pleura, measuring 4.7 x 1.7 cm, most likely atelectasis. Additional probable atelectasis at the left lung base. Hepatobiliary: Cirrhotic appearing liver. Single gallstone measuring approximately 6 mm. Mild gallbladder wall thickening which is likely related to the adjacent liver disease and/or ascites. No bile duct dilatation. Pancreas: Unremarkable. No pancreatic ductal dilatation or surrounding inflammatory changes. Spleen: Splenomegaly. Adrenals/Urinary Tract: Adrenal glands appear normal. Kidneys are unremarkable without mass, stone or hydronephrosis. No ureteral or bladder calculi identified. Bladder is decompressed. Stomach/Bowel: Bowel is normal in caliber. No bowel wall thickening or evidence of bowel wall inflammation appreciated, although characterization of some portions of the bowel is limited by patient motion artifact. Vascular/Lymphatic: Aortic atherosclerosis. Large varices about the splenic hilum. Abnormally prominent caliber of the left renal vein, of uncertain chronicity, possibly also related to the presumed portal venous hypertension. No enlarged lymph nodes seen in the abdomen or pelvis. Reproductive: Uterus and bilateral adnexa are unremarkable. Other: Small amount of ascites within the abdomen and pelvis. No abscess collection seen. No free intraperitoneal air. Musculoskeletal: No acute or suspicious  osseous finding. Right hip arthroplasty hardware appears appropriately positioned. Ill-defined edema within the subcutaneous soft tissues of the abdomen and pelvis indicating some degree of anasarca. LUMBAR SPINE CT: Mild dextroscoliosis centered at the L3 vertebral body level. Alignment appears otherwise normal. No evidence of acute vertebral body subluxation. No fracture line or displaced fracture fragment. No acute or suspicious osseous lesion. Mild disc desiccations at the L2-3 and L3-4 levels, with associated mild disc space narrowings. Mild diffuse disc bulge at L5-S1, with probable mild neural foramen encroachment bilaterally. No large disc bulge or protrusion at any level. No significant central canal stenosis at any level. The immediate paravertebral soft tissues are unremarkable. IMPRESSION: 1. Cirrhotic liver. Associated splenomegaly and upper abdominal varices. Associated small amount of ascites in the abdomen and pelvis. 2. Cholelithiasis without evidence of acute cholecystitis. 3. Masslike consolidation within the right lower lung, lateral aspects of the right lower lobe abutting the pleura, measuring  4.7 cm greatest dimension, most likely atelectasis. Recommend follow-up chest CT at some point to ensure resolution. 4. No acute findings within the lumbar spine. Mild scoliosis. Mild degenerative change within the lumbar spine, as detailed above. No large disc bulge or protrusion seen. 5. Anasarca. 6. Aortic atherosclerosis. Electronically Signed   By: Bary Richard M.D.   On: 06/26/2017 13:28   Dg Chest 2 View  Result Date: 06/17/2017 CLINICAL DATA:  Short of breath. Wheezing. Cirrhosis. Alcohol dependence. EXAM: CHEST - 2 VIEW COMPARISON:  09/08/2016 FINDINGS: Midline trachea. Cardiomegaly, accentuated by AP portable frontal technique. Small right pleural effusion versus pleural thickening, blunting the right costophrenic angle. No pneumothorax. Moderate pulmonary interstitial prominence and  indistinctness. No lobar consolidation. IMPRESSION: Cardiomegaly with moderate interstitial edema. Small right pleural effusion versus pleural thickening. Electronically Signed   By: Jeronimo Greaves M.D.   On: 07/04/2017 13:05   US Abdomen Complete  Result Date: 06/26/2017 CLINICAL DATA:  Hepatic cirrhosis with abdominal distension EXAM: ABDOMEN ULTRASOUND COMPLETE COMPARISON:  CT abdomen and pelvis June 25, 2017 FINDINGS: Gallbladder: Within the gallbladder, there are echogenic foci which move and shadow consistent with cholelithiasis. Largest gallstone measures 1.0 cm in length. Sludge is also noted in the gallbladder. The gallbladder wall is mildly thickened without edema or pericholecystic fluid. No sonographic Murphy sign noted by sonographer. Common bile duct: Diameter: 5 mm. No intrahepatic, common hepatic, or common bile dilatation. Liver: No focal lesion identified. Liver has a nodular contour with an overall increase in echogenicity. Portal vein is patent on color Doppler imaging with normal direction of blood flow towards the liver. IVC: No abnormality visualized. Pancreas: Visualized portion unremarkable. Much of the pancreas is obscured by gas. Spleen: Spleen measures 13.0 x 13.7 x 8.3 cm with a measured splenic volume of 769 cubic cm. No focal splenic lesions are evident. Note that there are varices in the upper abdomen on the left. Right Kidney: Length: 12.4 cm. Echogenicity within normal limits. No mass or hydronephrosis visualized. Left Kidney: Length: 12.9 cm. Echogenicity within normal limits. No mass or hydronephrosis visualized. Abdominal aorta: No aneurysm visualized. Other findings: There is fairly mild ascites. There are small pleural effusions bilaterally. IMPRESSION: 1. Cholelithiasis with mild gallbladder wall thickening. The gallbladder wall thickening may be due to underlying ascites. However, early acute cholecystitis must be of concern in this circumstance. This finding may warrant  nuclear medicine hepatobiliary imaging study to assess for cystic duct patency. 2. The appearance of the liver is indicative of hepatic cirrhosis. While no focal liver lesions are evident on this study, it must be cautioned that the sensitivity of ultrasound for detection of focal liver lesions is diminished in this circumstance. 3.  Splenomegaly with perisplenic varices. 4.  Mild ascites.  Small pleural effusions evident bilaterally. 5.  Pancreas largely obscured by gas. Electronically Signed   By: Bretta Bang III M.D.   On: 06/26/2017 07:25   Ct L-spine No Charge  Result Date: 06/17/2017 CLINICAL DATA:  Low back pain radiating into left leg. Left leg numbness. Back pain for 3 weeks, new leg symptoms. Denies incontinence of stool or urine. History of chronic back pain and hip surgery. EXAM: CT ABDOMEN AND PELVIS WITHOUT CONTRAST CT LUMBAR SPINE WITHOUT CONTRAST TECHNIQUE: Multidetector CT imaging of the abdomen and pelvis was performed following the standard protocol without IV contrast. COMPARISON:  None. FINDINGS: CHEST CT: Lower chest: Masslike consolidation within the lateral aspects of the right lower lobe, abutting the pleura, measuring 4.7 x 1.7  cm, most likely atelectasis. Additional probable atelectasis at the left lung base. Hepatobiliary: Cirrhotic appearing liver. Single gallstone measuring approximately 6 mm. Mild gallbladder wall thickening which is likely related to the adjacent liver disease and/or ascites. No bile duct dilatation. Pancreas: Unremarkable. No pancreatic ductal dilatation or surrounding inflammatory changes. Spleen: Splenomegaly. Adrenals/Urinary Tract: Adrenal glands appear normal. Kidneys are unremarkable without mass, stone or hydronephrosis. No ureteral or bladder calculi identified. Bladder is decompressed. Stomach/Bowel: Bowel is normal in caliber. No bowel wall thickening or evidence of bowel wall inflammation appreciated, although characterization of some portions of  the bowel is limited by patient motion artifact. Vascular/Lymphatic: Aortic atherosclerosis. Large varices about the splenic hilum. Abnormally prominent caliber of the left renal vein, of uncertain chronicity, possibly also related to the presumed portal venous hypertension. No enlarged lymph nodes seen in the abdomen or pelvis. Reproductive: Uterus and bilateral adnexa are unremarkable. Other: Small amount of ascites within the abdomen and pelvis. No abscess collection seen. No free intraperitoneal air. Musculoskeletal: No acute or suspicious osseous finding. Right hip arthroplasty hardware appears appropriately positioned. Ill-defined edema within the subcutaneous soft tissues of the abdomen and pelvis indicating some degree of anasarca. LUMBAR SPINE CT: Mild dextroscoliosis centered at the L3 vertebral body level. Alignment appears otherwise normal. No evidence of acute vertebral body subluxation. No fracture line or displaced fracture fragment. No acute or suspicious osseous lesion. Mild disc desiccations at the L2-3 and L3-4 levels, with associated mild disc space narrowings. Mild diffuse disc bulge at L5-S1, with probable mild neural foramen encroachment bilaterally. No large disc bulge or protrusion at any level. No significant central canal stenosis at any level. The immediate paravertebral soft tissues are unremarkable. IMPRESSION: 1. Cirrhotic liver. Associated splenomegaly and upper abdominal varices. Associated small amount of ascites in the abdomen and pelvis. 2. Cholelithiasis without evidence of acute cholecystitis. 3. Masslike consolidation within the right lower lung, lateral aspects of the right lower lobe abutting the pleura, measuring 4.7 cm greatest dimension, most likely atelectasis. Recommend follow-up chest CT at some point to ensure resolution. 4. No acute findings within the lumbar spine. Mild scoliosis. Mild degenerative change within the lumbar spine, as detailed above. No large disc  bulge or protrusion seen. 5. Anasarca. 6. Aortic atherosclerosis. Electronically Signed   By: Bary Richard M.D.   On: 07/07/2017 13:28   Dg Chest Port 1 View  Result Date: 07/16/2017 CLINICAL DATA:  Central line placement. EXAM: PORTABLE CHEST 1 VIEW COMPARISON:  Chest radiograph performed earlier today at 9:18 p.m. FINDINGS: The patient's right IJ line is noted ending about the proximal SVC. The endotracheal tube is seen ending 4 cm above the carina. Lung expansion is mildly improved. Vascular congestion is noted. Increased interstitial markings likely reflect pulmonary edema. Small bilateral pleural effusions are suspected. No pneumothorax is seen. The cardiomediastinal silhouette is mildly enlarged. No acute osseous abnormalities are identified. IMPRESSION: 1. Right IJ line noted ending about the proximal SVC. 2. Endotracheal tube seen ending 4 cm above the carina. 3. Lung expansion is mildly improved. Vascular congestion and mild cardiomegaly. Increased interstitial markings likely reflect pulmonary edema. Small bilateral pleural effusions suspected. Electronically Signed   By: Roanna Raider M.D.   On: 06/21/2017 22:33   Dg Chest Port 1 View  Result Date: 07/13/2017 CLINICAL DATA:  Assess endotracheal tube position. EXAM: PORTABLE CHEST 1 VIEW COMPARISON:  Chest radiograph performed earlier today at 7:12 p.m. FINDINGS: The patient's endotracheal tube is seen ending 2-3 cm above the carina. The  lungs are hypoexpanded. Patchy bilateral airspace opacities are improved from the prior study and may reflect mild pulmonary edema or pneumonia. No definite pleural effusion or pneumothorax is seen. The cardiomediastinal silhouette is borderline enlarged. No acute osseous abnormalities are identified. IMPRESSION: 1. Endotracheal tube seen ending 2-3 cm above the carina. 2. Lungs remain hypoexpanded. Patchy bilateral airspace opacities are improved from the recent prior study and may reflect mild pulmonary edema  or pneumonia. 3. Borderline cardiomegaly. Electronically Signed   By: Roanna Raider M.D.   On: 07/08/2017 22:12   Dg Chest Portable 1 View  Result Date: 07/01/2017 CLINICAL DATA:  Status post intubation. EXAM: PORTABLE CHEST 1 VIEW COMPARISON:  07/14/2017 at 1634 hours FINDINGS: Endotracheal tube tip projects 3.8 cm above the carina. Bilateral airspace lung opacities appear mildly increased when compared to the earlier exam. IMPRESSION: 1. Endotracheal tube tip well positioned 3.8 cm above the carina. 2. Worsened pulmonary edema since the earlier study. Electronically Signed   By: Amie Portland M.D.   On: 07/08/2017 19:36   Dg Chest Portable 1 View  Result Date: 07/14/2017 CLINICAL DATA:  Dyspnea and low back pain today. EXAM: PORTABLE CHEST 1 VIEW COMPARISON:  Chest x-ray from earlier same day. FINDINGS: Study is hypoinspiratory limiting comparison to the earlier exam. Heart size and mediastinal contours are grossly stable. Again noted is diffuse bilateral interstitial edema, perhaps slightly progressed compared to the earlier exam. IMPRESSION: Cardiomegaly and bilateral pulmonary edema, perhaps slightly worsened compared to today's earlier exam, although the appearance of worsening may merely be due to the low lung volumes on this chest x-ray. Electronically Signed   By: Bary Richard M.D.   On: 07/01/2017 16:55     STUDIES:  CTA 3/10 > 1. Cirrhotic liver. Associated splenomegaly and upper abdominal varices. Associated small amount of ascites in the abdomen and pelvis. 2. Cholelithiasis without evidence of acute cholecystitis. 3. Masslike consolidation within the right lower lung, lateral aspects of the right lower lobe abutting the pleura, measuring 4.7 cm greatest dimension, most likely atelectasis. Recommend follow-up chest CT at some point to ensure resolution. 4. No acute findings within the lumbar spine. Mild scoliosis. Mild degenerative change within the lumbar spine, as detailed  above. No large disc bulge or protrusion seen. 5. Anasarca. 6. Aortic atherosclerosis. CXR 3/10 > Endotracheal tube seen ending 2-3 cm above the carina.Lungs remain hypoexpanded. Patchy bilateral airspace opacities are improved from the recent prior study and may reflect mild pulmonary edema or pneumonia. Borderline cardiomegaly.  CULTURES: Blood 3/10 >> 2 of 2 coag negative staph >>  Urine 3/10 >>  Resp 3/10  >> rare GNR >>   ANTIBIOTICS: Rocephin 3/10  Vancomycin 3/11 >> Zosyn 3/11 >>    SIGNIFICANT EVENTS: 3/10 > Presents to OSH  3/10 > Status post left lower extremity femoral embolectomy and 4 compartment fasciotomy  LINES/TUBES: ETT 3/10 >> RIJ 3/10 >>   DISCUSSION: 52 year old female presents to ED with reported left leg numbness for the last few days.  Noted to have possible UTI, gram-positive bacteremia, evolving shock and respiratory failure.  Vascular was consulted as patient was found to have no pulses below the knee.   ASSESSMENT / PLAN:  PULMONARY A: Acute Hypoxic Respiratory Failure  Masslike consolidation within the right lower lung, lateral aspects of the right lower lobe abutting the pleura, measuring 4.7 H/O Tobacco Use  P:   Continue current ventilator support, okay to transition to pressure support as tolerated but no plans to  extubate until we ensure him a dynamic stability, no evidence of active GI blood loss Follow chest x-ray, intermittent ABG She will need workup for right lower lobe lateral opacity as we go forward, some concern for possible mass lesion  CARDIOVASCULAR A:  Hypotension in setting of hypovolemia, hemorraghic, or septic shock  New A.Fib  Ischemic Left Foot, status post left femoral embolectomy and fasciotomy on 3/10 P:  Wean phenylephrine as able, continue vasopressin for now Left femoral wound care Timing of anticoagulation will be influenced by GI evaluation Echocardiogram to evaluate for possible endocarditis, left  ventricular function Stress dose steroids  RENAL A:   Anion Gap Metabolic Acidosis with Lactic Acidosis, clearing Hypomagnesemia, improved P:   Follow BMP and urine output Replace electrolytes as indicated Decrease maintenance IV fluids to 50 cc/h  GASTROINTESTINAL A:   Alcoholic cirrhosis with evidence for active GI blood loss +Hematemesis and Hematochezia   Esophageal Varices  P:   Continue n.p.o. Continue Protonix as ordered Continue octreotide drip Appreciate gastroenterology evaluation.  Will give vitamin K x1 now, plan for EGD in the next couple days.  This will influence whether we can start systemic anticoagulation given the embolic phenomenon  HEMATOLOGIC A:   Chronic Anemia  +Hematemesis and Hematochezia Thrombocytopenia  P:  Follow CBC Transfusion goal hemoglobin >7  Continue to hold anticoagulation but would like to initiate when possible given the lower extremity embolism  INFECTIOUS A:   Bacteremia > 2 of 2 coag negative staph, sensitivities pending U/A with many bacteria  P:   Based on culture data continue Zosyn, stop vancomycin on 3/11 Follow cultures to completion Echocardiogram given gram-positive bacteremia as above  ENDOCRINE A:   No issues    P:   Following CBG Cortisol 20, DC stress dose steroids 3/11  NEUROLOGIC A:   ETOH withdrawal  H/O ETOH Abuse  P:   RASS goal: 0/-1 Precedex and fentanyl, wean as able Continue folic acid, thiamine   FAMILY  - Updates: Family updated at bedside by Dr Merlene PullingHammonds   - Inter-disciplinary family meet or Palliative Care meeting due by:  07/02/2017  Independent critical care time 35 minutes  Levy Pupaobert Waynetta Metheny, MD, PhD 06/26/2017, 11:31 AM [facility] [phone] or if no answer [phone]

## 2017-06-26 NOTE — Progress Notes (Signed)
Left message with Dr. Bosie HelperEarly's nurse about bleeding at wound vac site. Reinforced dressing. Will continue to monitor.  Montine CircleKim Julian Medina RN

## 2017-06-26 NOTE — Progress Notes (Addendum)
  Progress Note    06/26/2017 8:51 AM 1 Day Post-Op  Subjective:  Intubated and sedated   Vitals:   06/26/17 0800 06/26/17 0801  BP: 91/68 (!) 84/53  Pulse: 84 94  Resp: (!) 9 20  Temp:    SpO2: 99% 98%   Physical Exam: Lungs:  Mechanical ventilation Incisions:  L groin incision unremarkable without hematoma or drainage; L fasciotomy incisions with wound vacs in place Extremities:  L AT by doppler; distal foot remains cool to touch but somewhat symmetrical with R foot Abdomen:  Soft Neurologic: sedated  CBC    Component Value Date/Time   WBC 29.7 (H) 06/26/2017 0515   RBC 2.80 (L) 06/26/2017 0515   HGB 9.0 (L) 06/26/2017 0515   HCT 27.9 (L) 06/26/2017 0515   PLT 144 (L) 06/26/2017 0515   MCV 99.6 06/26/2017 0515   MCH 32.1 06/26/2017 0515   MCHC 32.3 06/26/2017 0515   RDW 19.8 (H) 06/26/2017 0515   LYMPHSABS 1.4 06/24/2017 2103   MONOABS 1.8 (H) 07/02/2017 2103   EOSABS 0.0 07/10/2017 2103   BASOSABS 0.0 06/23/2017 2103    BMET    Component Value Date/Time   NA 134 (L) 06/26/2017 0614   K 5.6 (H) 06/26/2017 0614   CL 105 06/26/2017 0614   CO2 18 (L) 06/26/2017 0614   GLUCOSE 103 (H) 06/26/2017 0614   BUN 27 (H) 06/26/2017 0614   CREATININE 1.78 (H) 06/26/2017 0614   CALCIUM 6.6 (L) 06/26/2017 0614   GFRNONAA 32 (L) 06/26/2017 0614   GFRAA 37 (L) 06/26/2017 0614    INR    Component Value Date/Time   INR 2.36 06/26/2017 0614     Intake/Output Summary (Last 24 hours) at 06/26/2017 0851 Last data filed at 06/26/2017 0800 Gross per 24 hour  Intake 5423.13 ml  Output 205 ml  Net 5218.13 ml     Assessment/Plan:  52 y.o. female is s/p L femoral thrombectomy and fasciotomy 1 Day Post-Op   L AT by doppler however distal foot cool to touch; patient still requiring pressor support currently Agree with IV antibiotics Continue wound vac to fasciotomy incisions Gauze placed in L groin fold to prevent incision breakdown Initiate anticoagulation when ok  with GI Critical care per primary team We will continue to follow and monitor peripheral ischemia   Emilie RutterMatthew Eveland, PA-C [facility] [phone] 06/26/2017 8:51 AM   I have examined the patient, reviewed and agree with above.  Left lower extremity well perfused.  We will plan return to the operating room in 48 hours for VAC change and possible closure of skin fasciotomy sites.  Plan per critical care medicine  Gretta Beganodd Domanique Huesman, MD 06/26/2017 12:02 PM

## 2017-06-26 NOTE — Progress Notes (Signed)
Pharmacy Antibiotic Note  Whitney Mathis is a 52 y.o. female admitted on 2018-02-02 with LLE ischemia, now s/p thrombectomy, initially started on Rocephin for UTI, now to broaden for concern for sepsis.  Pharmacy has been consulted for Vancocin and Zosyn dosing.  Plan: Vancomycin 1500mg  x1 then 1000mg  IV every 12 hours.  Goal trough 15-20 mcg/mL. Zosyn 3.375g IV q8h (4 hour infusion).  Height: 5\' 4"  (162.6 cm) Weight: 179 lb 0.2 oz (81.2 kg) IBW/kg (Calculated) : 54.7  Temp (24hrs), Avg:98.5 F (36.9 C), Min:98 F (36.7 C), Max:98.8 F (37.1 C)  Recent Labs  Lab 10-26-17 1203 10-26-17 1648 10-26-17 2103 10-26-17 2138  WBC 25.1*  --  22.0*  --   CREATININE 0.84 0.90 1.20* 1.10*  LATICACIDVEN  --   --  3.1*  --     Estimated Creatinine Clearance: 62.4 mL/min (A) (by C-G formula based on SCr of 1.1 mg/dL (H)).    Allergies  Allergen Reactions  . Codeine Nausea And Vomiting     Thank you for allowing pharmacy to be a part of this patient's care.  Vernard GamblesVeronda Kaesha Kirsch, PharmD, BCPS  06/26/2017 5:52 AM

## 2017-06-27 ENCOUNTER — Inpatient Hospital Stay (HOSPITAL_COMMUNITY): Payer: BLUE CROSS/BLUE SHIELD

## 2017-06-27 ENCOUNTER — Encounter (HOSPITAL_COMMUNITY): Admission: EM | Disposition: E | Payer: Self-pay | Source: Home / Self Care | Attending: Critical Care Medicine

## 2017-06-27 DIAGNOSIS — D696 Thrombocytopenia, unspecified: Secondary | ICD-10-CM

## 2017-06-27 DIAGNOSIS — D649 Anemia, unspecified: Secondary | ICD-10-CM

## 2017-06-27 DIAGNOSIS — I330 Acute and subacute infective endocarditis: Secondary | ICD-10-CM

## 2017-06-27 DIAGNOSIS — K921 Melena: Secondary | ICD-10-CM

## 2017-06-27 DIAGNOSIS — K7460 Unspecified cirrhosis of liver: Secondary | ICD-10-CM

## 2017-06-27 LAB — PROTIME-INR
INR: 2.88
Prothrombin Time: 29.9 seconds — ABNORMAL HIGH (ref 11.4–15.2)

## 2017-06-27 LAB — CBC
HCT: 29.3 % — ABNORMAL LOW (ref 36.0–46.0)
HEMATOCRIT: 30.3 % — AB (ref 36.0–46.0)
HEMATOCRIT: 27.5 % — AB (ref 36.0–46.0)
HEMATOCRIT: 26.1 % — AB (ref 36.0–46.0)
HEMOGLOBIN: 9.4 g/dL — AB (ref 12.0–15.0)
HEMOGLOBIN: 9.1 g/dL — AB (ref 12.0–15.0)
HEMOGLOBIN: 7.8 g/dL — AB (ref 12.0–15.0)
Hemoglobin: 7.8 g/dL — ABNORMAL LOW (ref 12.0–15.0)
MCH: 33 pg (ref 26.0–34.0)
MCH: 32.2 pg (ref 26.0–34.0)
MCH: 31.3 pg (ref 26.0–34.0)
MCH: 31.7 pg (ref 26.0–34.0)
MCHC: 32.1 g/dL (ref 30.0–36.0)
MCHC: 30 g/dL (ref 30.0–36.0)
MCHC: 28.4 g/dL — ABNORMAL LOW (ref 30.0–36.0)
MCHC: 29.9 g/dL — AB (ref 30.0–36.0)
MCV: 102.8 fL — AB (ref 78.0–100.0)
MCV: 107.1 fL — ABNORMAL HIGH (ref 78.0–100.0)
MCV: 110.4 fL — AB (ref 78.0–100.0)
MCV: 106.1 fL — AB (ref 78.0–100.0)
Platelets: 164 10*3/uL (ref 150–400)
Platelets: 150 10*3/uL (ref 150–400)
Platelets: 131 10*3/uL — ABNORMAL LOW (ref 150–400)
Platelets: UNDETERMINED 10*3/uL (ref 150–400)
RBC: 2.85 MIL/uL — ABNORMAL LOW (ref 3.87–5.11)
RBC: 2.83 MIL/uL — AB (ref 3.87–5.11)
RBC: 2.49 MIL/uL — ABNORMAL LOW (ref 3.87–5.11)
RBC: 2.46 MIL/uL — ABNORMAL LOW (ref 3.87–5.11)
RDW: 20 % — ABNORMAL HIGH (ref 11.5–15.5)
RDW: 19.7 % — ABNORMAL HIGH (ref 11.5–15.5)
RDW: 20.1 % — ABNORMAL HIGH (ref 11.5–15.5)
RDW: 19.8 % — AB (ref 11.5–15.5)
WBC: 33.9 10*3/uL — AB (ref 4.0–10.5)
WBC: 33.9 10*3/uL — ABNORMAL HIGH (ref 4.0–10.5)
WBC: 45.9 10*3/uL — ABNORMAL HIGH (ref 4.0–10.5)
WBC: 33.4 10*3/uL — ABNORMAL HIGH (ref 4.0–10.5)

## 2017-06-27 LAB — POCT I-STAT, CHEM 8
BUN: 37 mg/dL — ABNORMAL HIGH (ref 6–20)
CHLORIDE: 101 mmol/L (ref 101–111)
Calcium, Ion: 0.7 mmol/L — CL (ref 1.15–1.40)
Creatinine, Ser: 3.2 mg/dL — ABNORMAL HIGH (ref 0.44–1.00)
Glucose, Bld: 81 mg/dL (ref 65–99)
HEMATOCRIT: 25 % — AB (ref 36.0–46.0)
HEMOGLOBIN: 8.5 g/dL — AB (ref 12.0–15.0)
POTASSIUM: 5.1 mmol/L (ref 3.5–5.1)
SODIUM: 139 mmol/L (ref 135–145)
TCO2: 21 mmol/L — ABNORMAL LOW (ref 22–32)

## 2017-06-27 LAB — RENAL FUNCTION PANEL
ALBUMIN: 1.9 g/dL — AB (ref 3.5–5.0)
Anion gap: 18 — ABNORMAL HIGH (ref 5–15)
BUN: 34 mg/dL — AB (ref 6–20)
CO2: 16 mmol/L — ABNORMAL LOW (ref 22–32)
Calcium: 5.8 mg/dL — CL (ref 8.9–10.3)
Chloride: 103 mmol/L (ref 101–111)
Creatinine, Ser: 3.71 mg/dL — ABNORMAL HIGH (ref 0.44–1.00)
GFR calc Af Amer: 15 mL/min — ABNORMAL LOW (ref >60)
GFR, EST NON AFRICAN AMERICAN: 13 mL/min — AB (ref >60)
Glucose, Bld: 67 mg/dL (ref 65–99)
PHOSPHORUS: 8.9 mg/dL — AB (ref 2.5–4.6)
Potassium: 5.6 mmol/L — ABNORMAL HIGH (ref 3.5–5.1)
SODIUM: 137 mmol/L (ref 135–145)

## 2017-06-27 LAB — POCT I-STAT 3, ART BLOOD GAS (G3+)
ACID-BASE DEFICIT: 12 mmol/L — AB (ref 0.0–2.0)
Bicarbonate: 17.9 mmol/L — ABNORMAL LOW (ref 20.0–28.0)
O2 Saturation: 79 %
PH ART: 7.106 — AB (ref 7.350–7.450)
TCO2: 20 mmol/L — ABNORMAL LOW (ref 22–32)
pCO2 arterial: 56.8 mmHg — ABNORMAL HIGH (ref 32.0–48.0)
pO2, Arterial: 59 mmHg — ABNORMAL LOW (ref 83.0–108.0)

## 2017-06-27 LAB — BLOOD GAS, ARTERIAL
Acid-base deficit: 6.3 mmol/L — ABNORMAL HIGH (ref 0.0–2.0)
BICARBONATE: 20.3 mmol/L (ref 20.0–28.0)
FIO2: 100
LHR: 32 {breaths}/min
O2 Saturation: 94.1 %
PEEP: 9 cmH2O
PO2 ART: 71.5 mmHg — AB (ref 83.0–108.0)
Patient temperature: 95.3
VT: 470 mL
pCO2 arterial: 47.9 mmHg (ref 32.0–48.0)
pH, Arterial: 7.237 — ABNORMAL LOW (ref 7.350–7.450)

## 2017-06-27 LAB — BASIC METABOLIC PANEL
ANION GAP: 15 (ref 5–15)
Anion gap: 11 (ref 5–15)
Anion gap: 16 — ABNORMAL HIGH (ref 5–15)
BUN: 39 mg/dL — ABNORMAL HIGH (ref 6–20)
BUN: 41 mg/dL — ABNORMAL HIGH (ref 6–20)
BUN: 40 mg/dL — ABNORMAL HIGH (ref 6–20)
CALCIUM: 6.1 mg/dL — AB (ref 8.9–10.3)
CALCIUM: 5.9 mg/dL — AB (ref 8.9–10.3)
CHLORIDE: 107 mmol/L (ref 101–111)
CHLORIDE: 110 mmol/L (ref 101–111)
CO2: 16 mmol/L — ABNORMAL LOW (ref 22–32)
CO2: 12 mmol/L — AB (ref 22–32)
CO2: 14 mmol/L — ABNORMAL LOW (ref 22–32)
Calcium: 6 mg/dL — CL (ref 8.9–10.3)
Chloride: 108 mmol/L (ref 101–111)
Creatinine, Ser: 3.52 mg/dL — ABNORMAL HIGH (ref 0.44–1.00)
Creatinine, Ser: 4.02 mg/dL — ABNORMAL HIGH (ref 0.44–1.00)
Creatinine, Ser: 4.16 mg/dL — ABNORMAL HIGH (ref 0.44–1.00)
GFR calc Af Amer: 13 mL/min — ABNORMAL LOW (ref >60)
GFR calc non Af Amer: 12 mL/min — ABNORMAL LOW (ref >60)
GFR, EST AFRICAN AMERICAN: 16 mL/min — AB (ref >60)
GFR, EST AFRICAN AMERICAN: 14 mL/min — AB (ref >60)
GFR, EST NON AFRICAN AMERICAN: 14 mL/min — AB (ref >60)
GFR, EST NON AFRICAN AMERICAN: 11 mL/min — AB (ref >60)
GLUCOSE: 67 mg/dL (ref 65–99)
Glucose, Bld: 107 mg/dL — ABNORMAL HIGH (ref 65–99)
Glucose, Bld: 80 mg/dL (ref 65–99)
POTASSIUM: 6.5 mmol/L — AB (ref 3.5–5.1)
Potassium: 5.9 mmol/L — ABNORMAL HIGH (ref 3.5–5.1)
Potassium: 6.2 mmol/L — ABNORMAL HIGH (ref 3.5–5.1)
SODIUM: 134 mmol/L — AB (ref 135–145)
SODIUM: 138 mmol/L (ref 135–145)
Sodium: 137 mmol/L (ref 135–145)

## 2017-06-27 LAB — POC OCCULT BLOOD, ED: FECAL OCCULT BLD: POSITIVE — AB

## 2017-06-27 LAB — AMMONIA: AMMONIA: 92 umol/L — AB (ref 9–35)

## 2017-06-27 LAB — URINE CULTURE: CULTURE: NO GROWTH

## 2017-06-27 LAB — PROCALCITONIN: Procalcitonin: 3.43 ng/mL

## 2017-06-27 LAB — GLUCOSE, CAPILLARY
GLUCOSE-CAPILLARY: 67 mg/dL (ref 65–99)
Glucose-Capillary: 109 mg/dL — ABNORMAL HIGH (ref 65–99)
Glucose-Capillary: 70 mg/dL (ref 65–99)

## 2017-06-27 LAB — PHOSPHORUS: PHOSPHORUS: 8.9 mg/dL — AB (ref 2.5–4.6)

## 2017-06-27 LAB — CK
Total CK: 1474 U/L — ABNORMAL HIGH (ref 38–234)
Total CK: 1783 U/L — ABNORMAL HIGH (ref 38–234)

## 2017-06-27 LAB — MAGNESIUM: MAGNESIUM: 2 mg/dL (ref 1.7–2.4)

## 2017-06-27 SURGERY — ESOPHAGOGASTRODUODENOSCOPY (EGD) WITH PROPOFOL
Anesthesia: Moderate Sedation

## 2017-06-27 MED ORDER — SODIUM CHLORIDE 0.9 % IV BOLUS (SEPSIS)
500.0000 mL | Freq: Once | INTRAVENOUS | Status: AC
  Administered 2017-06-27: 500 mL via INTRAVENOUS

## 2017-06-27 MED ORDER — SODIUM POLYSTYRENE SULFONATE PO POWD
30.0000 g | Freq: Once | ORAL | Status: DC
  Administered 2017-06-27: 30 g via ORAL
  Filled 2017-06-27: qty 30

## 2017-06-27 MED ORDER — SODIUM CHLORIDE 0.9 % IV SOLN
1.0000 g | Freq: Once | INTRAVENOUS | Status: AC
  Administered 2017-06-27: 1 g via INTRAVENOUS
  Filled 2017-06-27: qty 10

## 2017-06-27 MED ORDER — HEPARIN SODIUM (PORCINE) 1000 UNIT/ML DIALYSIS
1000.0000 [IU] | INTRAMUSCULAR | Status: DC | PRN
  Filled 2017-06-27: qty 6

## 2017-06-27 MED ORDER — DEXTROSE 5 % IV SOLN
0.5000 ug/min | INTRAVENOUS | Status: DC
  Administered 2017-06-27: 2 ug/min via INTRAVENOUS
  Administered 2017-06-28 – 2017-06-30 (×14): 20 ug/min via INTRAVENOUS
  Filled 2017-06-27 (×15): qty 4

## 2017-06-27 MED ORDER — INSULIN ASPART 100 UNIT/ML IV SOLN
10.0000 [IU] | Freq: Once | INTRAVENOUS | Status: AC
  Administered 2017-06-27: 10 [IU] via INTRAVENOUS

## 2017-06-27 MED ORDER — SODIUM BICARBONATE 8.4 % IV SOLN
50.0000 meq | Freq: Once | INTRAVENOUS | Status: AC
  Administered 2017-06-27: 50 meq via INTRAVENOUS
  Filled 2017-06-27: qty 50

## 2017-06-27 MED ORDER — SODIUM CHLORIDE 0.9 % FOR CRRT
INTRAVENOUS_CENTRAL | Status: DC | PRN
  Filled 2017-06-27: qty 1000

## 2017-06-27 MED ORDER — VITAMIN K1 10 MG/ML IJ SOLN
5.0000 mg | Freq: Once | INTRAVENOUS | Status: AC
  Administered 2017-06-27: 5 mg via INTRAVENOUS
  Filled 2017-06-27 (×2): qty 0.5

## 2017-06-27 MED ORDER — SODIUM POLYSTYRENE SULFONATE 15 GM/60ML PO SUSP: 30.0000 g | Freq: Once | ORAL | Status: DC

## 2017-06-27 MED ORDER — STERILE WATER FOR INJECTION IV SOLN
INTRAVENOUS | Status: DC
  Administered 2017-06-27 – 2017-06-29 (×23): via INTRAVENOUS_CENTRAL
  Filled 2017-06-27 (×12): qty 150
  Filled 2017-06-27: qty 100
  Filled 2017-06-27 (×16): qty 150

## 2017-06-27 MED ORDER — ALBUMIN HUMAN 25 % IV SOLN
12.5000 g | Freq: Once | INTRAVENOUS | Status: AC
  Administered 2017-06-27: 12.5 g via INTRAVENOUS
  Filled 2017-06-27: qty 50

## 2017-06-27 MED ORDER — NOREPINEPHRINE BITARTRATE 1 MG/ML IV SOLN: 0.0000 ug/min | INTRAVENOUS | Status: DC

## 2017-06-27 MED ORDER — NOREPINEPHRINE 16 MG/250ML-% IV SOLN
0.0000 ug/min | INTRAVENOUS | Status: DC
Start: 2017-06-27 — End: 2017-06-30
  Administered 2017-06-27: 2 ug/min via INTRAVENOUS
  Administered 2017-06-27 – 2017-06-30 (×11): 50 ug/min via INTRAVENOUS
  Filled 2017-06-27 (×12): qty 250

## 2017-06-27 MED ORDER — LACTULOSE 10 GM/15ML PO SOLN
30.0000 g | Freq: Three times a day (TID) | ORAL | Status: DC
  Filled 2017-06-27: qty 45

## 2017-06-27 MED ORDER — DEXTROSE 50 % IV SOLN
1.0000 | Freq: Once | INTRAVENOUS | Status: AC
  Administered 2017-06-27: 25 mL via INTRAVENOUS
  Filled 2017-06-27: qty 50

## 2017-06-27 MED ORDER — DEXTROSE 50 % IV SOLN
1.0000 | Freq: Once | INTRAVENOUS | Status: DC
  Administered 2017-06-27: 50 mL via INTRAVENOUS
  Filled 2017-06-27: qty 50

## 2017-06-27 MED ORDER — SODIUM CHLORIDE 0.9 % IV SOLN
Freq: Once | INTRAVENOUS | Status: AC
  Administered 2017-06-27: 11:00:00 via INTRAVENOUS

## 2017-06-27 MED ORDER — MIDAZOLAM HCL 2 MG/2ML IJ SOLN: 2.0000 mg | INTRAMUSCULAR | Status: DC | PRN

## 2017-06-27 MED ORDER — DEXTROSE 5 % IV SOLN
INTRAVENOUS | Status: DC
  Administered 2017-06-27 – 2017-06-29 (×2): via INTRAVENOUS

## 2017-06-27 MED ORDER — SODIUM POLYSTYRENE SULFONATE PO POWD
30.0000 g | Freq: Once | ORAL | Status: AC
  Administered 2017-06-27: 30 g via RECTAL

## 2017-06-27 MED ORDER — NAPHAZOLINE-GLYCERIN 0.012-0.2 % OP SOLN
1.0000 [drp] | Freq: Four times a day (QID) | OPHTHALMIC | Status: DC
  Administered 2017-06-27 – 2017-06-28 (×5): 2 [drp] via OPHTHALMIC
  Administered 2017-06-29 (×2): 1 [drp] via OPHTHALMIC
  Administered 2017-06-29 (×3): 2 [drp] via OPHTHALMIC
  Administered 2017-06-30: 1 [drp] via OPHTHALMIC
  Filled 2017-06-27: qty 15

## 2017-06-27 MED ORDER — PRISMASOL BGK 4/2.5 32-4-2.5 MEQ/L IV SOLN
INTRAVENOUS | Status: DC
  Administered 2017-06-27 – 2017-06-29 (×17): via INTRAVENOUS_CENTRAL
  Filled 2017-06-27 (×21): qty 5000

## 2017-06-27 MED ORDER — STERILE WATER FOR INJECTION IV SOLN
INTRAVENOUS | Status: DC
  Administered 2017-06-27 – 2017-06-29 (×15): via INTRAVENOUS_CENTRAL
  Filled 2017-06-27 (×24): qty 150

## 2017-06-27 MED ORDER — MIDAZOLAM HCL 2 MG/2ML IJ SOLN
2.0000 mg | INTRAMUSCULAR | Status: DC | PRN
Start: 2017-06-27 — End: 2017-06-30

## 2017-06-27 MED ORDER — SODIUM CHLORIDE 0.9 % IV SOLN
0.0000 ug/min | INTRAVENOUS | Status: DC
  Administered 2017-06-27: 400 ug/min via INTRAVENOUS
  Administered 2017-06-27: 320 ug/min via INTRAVENOUS
  Administered 2017-06-27 – 2017-06-30 (×18): 400 ug/min via INTRAVENOUS
  Filled 2017-06-27: qty 8
  Filled 2017-06-27: qty 6
  Filled 2017-06-27 (×4): qty 8
  Filled 2017-06-27: qty 5
  Filled 2017-06-27 (×5): qty 8
  Filled 2017-06-27: qty 5
  Filled 2017-06-27 (×2): qty 8
  Filled 2017-06-27: qty 6
  Filled 2017-06-27: qty 8

## 2017-06-27 MED ORDER — DEXTROSE 50 % IV SOLN
INTRAVENOUS | Status: AC
  Administered 2017-06-27: 25 mL via INTRAVENOUS
  Filled 2017-06-27: qty 50

## 2017-06-27 MED ORDER — LACTULOSE ENEMA
300.0000 mL | Freq: Every day | ORAL | Status: DC
  Filled 2017-06-27 (×4): qty 300

## 2017-06-27 MED ORDER — HYDROCORTISONE NA SUCCINATE PF 100 MG IJ SOLR
50.0000 mg | Freq: Four times a day (QID) | INTRAMUSCULAR | Status: DC
  Administered 2017-06-27 – 2017-06-30 (×12): 50 mg via INTRAVENOUS
  Filled 2017-06-27 (×12): qty 1
  Filled 2017-06-27: qty 2
  Filled 2017-06-27: qty 1

## 2017-06-27 MED ORDER — DEXTROSE 50 % IV SOLN
INTRAVENOUS | Status: AC
  Administered 2017-06-27: 25 mL
  Filled 2017-06-27: qty 50

## 2017-06-27 NOTE — Progress Notes (Signed)
Pt placed on PCV 15/5 d/t dysynchronny, unable to sync with patient placed back on PRVC per Dr. Inis SizerBryum

## 2017-06-27 NOTE — Consult Note (Signed)
[facility] Renal Consultation Note  Requesting MD: Byrum Indication for Consultation: AKI  HPI:  Whitney Mathis is a 52 y.o. female with ETOH cirrhosis c/b varices as well as tobacco use/abuse.  She presented to ER on 3/10 c/o leg numbness - had arterial clot- s/p thrombectomy and fasciotomy but was also  found to have UGIB and sepsis- later found bacteremia GPC possible endocarditis and now multi organ failure including AKI- no uop crt from 1.1 to 4 in 48 hours, K of 6.5 and CVP of 28 after volume resuscitation for sepsis protocol - lactate 3.1.  Troponin also elevated.  Prognosis is fel to be poor but feel that there may be some reversabilty to at least the infection/sepsis.  Would meet criteria for CRRT at this time  Creatinine, Ser  Date/Time Value Ref Range Status  07/08/2017 10:18 AM 4.02 (H) 0.44 - 1.00 mg/dL Final  06/21/2017 04:05 AM 3.52 (H) 0.44 - 1.00 mg/dL Final    Comment:    DELTA CHECK NOTED  06/26/2017 06:14 AM 1.78 (H) 0.44 - 1.00 mg/dL Final  07/09/2017 09:38 PM 1.10 (H) 0.44 - 1.00 mg/dL Final  06/26/2017 09:03 PM 1.20 (H) 0.44 - 1.00 mg/dL Final  06/21/2017 04:48 PM 0.90 0.44 - 1.00 mg/dL Final  07/09/2017 12:03 PM 0.84 0.44 - 1.00 mg/dL Final  12/14/2016 04:43 AM 0.41 (L) 0.44 - 1.00 mg/dL Final  12/13/2016 01:07 PM 0.30 (L) 0.44 - 1.00 mg/dL Final  09/08/2016 09:15 AM 0.47 0.44 - 1.00 mg/dL Final     PMHx:   Past Medical History:  Diagnosis Date  . Alcohol dependence ([place])    "stopped drinking in 09/19/16" - Update: continues to consume etoh (05/2017 [facility] note)  . Chronic back pain   . Chronic pain   . Cirrhosis ([place])   . Jaundice   . Lumbar radiculopathy   . Tobacco use     Past Surgical History:  Procedure Laterality Date  . JOINT REPLACEMENT Right    hip  . LEG SURGERY     right  . THROMBECTOMY FEMORAL ARTERY Left 06/21/2017   Procedure: THROMBECTOMY LEFT FEMORAL ARTERY; FASCIOTOMY LEFT MEDIAL AND LATERAL LOWER LEG;  Surgeon: Rosetta Posner, MD;  Location: [place];  Service: Vascular;  Laterality: Left;  . TONSILLECTOMY      Family Hx:  Family History  Problem Relation Age of Onset  . Bone cancer Mother 31    Social History:  reports that she has been smoking cigarettes.  She started smoking about 36 years ago. She has been smoking about 0.25 packs per day. she has never used smokeless tobacco. She reports that she drinks alcohol. She reports that she does not use drugs.  Allergies:  Allergies  Allergen Reactions  . Codeine Nausea And Vomiting    Medications: Prior to Admission medications   Medication Sig Start Date End Date Taking? Authorizing Provider  acetaminophen (TYLENOL) 325 MG tablet Take 650 mg by mouth every 6 (six) hours as needed for moderate pain.   Yes [provider]  azelastine (OPTIVAR) 0.05 % ophthalmic solution Place 1 drop into both eyes 2 (two) times daily. 01/13/17  Yes [provider]  diclofenac sodium (VOLTAREN) 1 % GEL Apply 1 application topically at bedtime. 06/20/17  Yes [provider]  furosemide (LASIX) 40 MG tablet Take 40 mg by mouth daily.  12/01/16  Yes [provider]  gabapentin (NEURONTIN) 800 MG tablet Take 1 tablet by mouth 3 (three) times daily as  needed for pain. 06/02/17  Yes [provider]  meloxicam (MOBIC) 15 MG tablet Take 1 tablet by mouth daily. 06/02/17  Yes [provider]  methocarbamol (ROBAXIN) 500 MG tablet Take 2 tablets by mouth at bedtime. 04/19/17  Yes [provider]  montelukast (SINGULAIR) 10 MG tablet Take 1 tablet by mouth daily. 05/17/17  Yes [provider]  spironolactone (ALDACTONE) 100 MG tablet Take 1 tablet by mouth daily. 06/17/17  Yes [provider]    I have reviewed the patient's current medications.  Labs:  Results for orders placed or performed during the hospital encounter of 06/24/2017 (from the past 48 hour(s))  Urinalysis, Routine w reflex microscopic     Status:  Abnormal   Collection Time: 07/01/2017  3:45 PM  Result Value Ref Range   Color, Urine AMBER (A) YELLOW    Comment: BIOCHEMICALS MAY BE AFFECTED BY COLOR   APPearance TURBID (A) CLEAR   Specific Gravity, Urine 1.020 1.005 - 1.030   pH 5.0 5.0 - 8.0   Glucose, UA NEGATIVE NEGATIVE mg/dL   Hgb urine dipstick LARGE (A) NEGATIVE   Bilirubin Urine MODERATE (A) NEGATIVE   Ketones, ur NEGATIVE NEGATIVE mg/dL   Protein, ur 100 (A) NEGATIVE mg/dL   Nitrite NEGATIVE NEGATIVE   Leukocytes, UA NEGATIVE NEGATIVE   RBC / HPF TOO NUMEROUS TO COUNT 0 - 5 RBC/hpf   WBC, UA 6-30 0 - 5 WBC/hpf   Bacteria, UA MANY (A) NONE SEEN   Squamous Epithelial / LPF 0-5 (A) NONE SEEN   Mucus PRESENT    Budding Yeast PRESENT    Hyaline Casts, UA PRESENT     Comment: Performed at [facility], [address], [place], [place] [address]  Urine rapid drug screen (hosp performed)     Status: Abnormal   Collection Time: 07/16/2017  3:45 PM  Result Value Ref Range   Opiates POSITIVE (A) NONE DETECTED   Cocaine NONE DETECTED NONE DETECTED   Benzodiazepines POSITIVE (A) NONE DETECTED   Amphetamines NONE DETECTED NONE DETECTED   Tetrahydrocannabinol NONE DETECTED NONE DETECTED   Barbiturates NONE DETECTED NONE DETECTED    Comment: (NOTE) DRUG SCREEN FOR MEDICAL PURPOSES ONLY.  IF CONFIRMATION IS NEEDED FOR ANY PURPOSE, NOTIFY LAB WITHIN 5 DAYS. LOWEST DETECTABLE LIMITS FOR URINE DRUG SCREEN Drug Class                     Cutoff (ng/mL) Amphetamine and metabolites    1000 Barbiturate and metabolites    200 Benzodiazepine                 657 Tricyclics and metabolites     300 Opiates and metabolites        300 Cocaine and metabolites        300 THC                            50 Performed at [facility], [address] [place], [place] [address]   Urine culture     Status: None   Collection Time: 06/24/2017  3:45 PM  Result Value Ref Range   Specimen Description      URINE, CATHETERIZED Performed at [facility], [address], [place], [place] [address]    Special Requests      NONE Performed at [facility], [address], [place], [place] [address]    Culture  NO GROWTH Performed at [facility], [place] [address] [place], [place] [address]    Report Status 06/18/2017 FINAL   Blood gas, arterial     Status: Abnormal   Collection Time: 06/18/2017  4:43 PM  Result Value Ref Range   O2 Content 4.0 L/min   Delivery systems NO CHARGE    pH, Arterial 7.431 7.350 - 7.450   pCO2 arterial 31.7 (L) 32.0 - 48.0 mmHg   pO2, Arterial 75.1 (L) 83.0 - 108.0 mmHg   Bicarbonate 22.2 20.0 - 28.0 mmol/L   O2 Saturation 93.6 %   Collection site RIGHT RADIAL    Drawn by [id number]    Sample type ARTERIAL DRAW    Allens test (pass/fail) PASS PASS    Comment: Performed at [facility], [address] [place], [place] [address]  I-stat Chem 8, ED     Status: Abnormal   Collection Time: 07/15/2017  4:48 PM  Result Value Ref Range   Sodium 132 (L) 135 - 145 mmol/L   Potassium 4.8 3.5 - 5.1 mmol/L   Chloride 102 101 - 111 mmol/L   BUN 25 (H) 6 - 20 mg/dL   Creatinine, Ser 0.90 0.44 - 1.00 mg/dL   Glucose, Bld 93 65 - 99 mg/dL   Calcium, Ion 0.80 (LL) 1.15 - 1.40 mmol/L   TCO2 21 (L) 22 - 32 mmol/L   Hemoglobin 10.2 (L) 12.0 - 15.0 g/dL   HCT 30.0 (L) 36.0 - 46.0 %  Troponin I     Status: Abnormal   Collection Time: 06/24/2017  5:31 PM  Result Value Ref Range   Troponin I 0.35 (HH) <0.03 ng/mL    Comment: CRITICAL VALUE NOTED.  VALUE IS CONSISTENT WITH PREVIOUSLY REPORTED AND CALLED VALUE. Performed at [facility], [address], [place], [place] [address]   Blood gas, arterial     Status: Abnormal (Preliminary result)   Collection Time: 07/08/2017  8:08 PM  Result Value Ref Range   FIO2 100.00    Delivery systems VENTILATOR    Mode PRESSURE REGULATED VOLUME CONTROL    VT 450 mL   LHR 16 resp/min   Peep/cpap 5.0 cm H20   pH, Arterial 7.347 (L) 7.350 - 7.450   pCO2 arterial 39.0 32.0 - 48.0 mmHg    pO2, Arterial 153 (H) 83.0 - 108.0 mmHg   Bicarbonate 21.2 20.0 - 28.0 mmol/L   Acid-Base Excess PENDING 0.0 - 2.0 mmol/L   Acid-base deficit 3.9 (H) 0.0 - 2.0 mmol/L   O2 Saturation 98.8 %   Collection site LEFT RADIAL    Drawn by [id number]    Sample type ARTERIAL DRAW    Allens test (pass/fail) PASS PASS    Comment: Performed at [facility], [address], [place], [place] [address]  Glucose, capillary     Status: None   Collection Time: 07/10/2017  8:49 PM  Result Value Ref Range   Glucose-Capillary 95 65 - 99 mg/dL   Comment 1 Capillary Specimen    Comment 2 Notify RN   MRSA PCR Screening     Status: None   Collection Time: 06/29/2017  8:55 PM  Result Value Ref Range   MRSA by PCR NEGATIVE NEGATIVE    Comment:        The GeneXpert MRSA Assay (FDA approved for NASAL specimens only), is one component of a comprehensive MRSA colonization surveillance program. It is not intended to diagnose MRSA infection nor to guide or monitor treatment for MRSA  infections. Performed at [facility], [place] [address] [place], [place] [address]   CBC with Differential/Platelet     Status: Abnormal   Collection Time: 07/13/2017  9:03 PM  Result Value Ref Range   WBC 22.0 (H) 4.0 - 10.5 K/uL    Comment: REPEATED TO VERIFY   RBC 2.66 (L) 3.87 - 5.11 MIL/uL   Hemoglobin 8.5 (L) 12.0 - 15.0 g/dL   HCT 25.9 (L) 36.0 - 46.0 %   MCV 97.4 78.0 - 100.0 fL   MCH 32.0 26.0 - 34.0 pg   MCHC 32.8 30.0 - 36.0 g/dL   RDW 19.5 (H) 11.5 - 15.5 %   Platelets 53 (L) 150 - 400 K/uL    Comment: REPEATED TO VERIFY SPECIMEN CHECKED FOR CLOTS PLATELET COUNT CONFIRMED BY SMEAR    Neutrophils Relative % 86 %   Neutro Abs 18.7 (H) 1.7 - 7.7 K/uL   Lymphocytes Relative 6 %   Lymphs Abs 1.4 0.7 - 4.0 K/uL   Monocytes Relative 8 %   Monocytes Absolute 1.8 (H) 0.1 - 1.0 K/uL   Eosinophils Relative 0 %   Eosinophils Absolute 0.0 0.0 - 0.7 K/uL   Basophils Relative 0 %   Basophils Absolute 0.0 0.0 - 0.1 K/uL     Comment: Performed at [facility], [place] [address], [place], [place] [address]  Basic metabolic panel     Status: Abnormal   Collection Time: 07/08/2017  9:03 PM  Result Value Ref Range   Sodium 133 (L) 135 - 145 mmol/L   Potassium 5.1 3.5 - 5.1 mmol/L   Chloride 105 101 - 111 mmol/L   CO2 18 (L) 22 - 32 mmol/L   Glucose, Bld 93 65 - 99 mg/dL   BUN 23 (H) 6 - 20 mg/dL   Creatinine, Ser 1.20 (H) 0.44 - 1.00 mg/dL   Calcium 6.8 (L) 8.9 - 10.3 mg/dL   GFR calc non Af Amer 51 (L) >60 mL/min   GFR calc Af Amer 60 (L) >60 mL/min    Comment: (NOTE) The eGFR has been calculated using the CKD EPI equation. This calculation has not been validated in all clinical situations. eGFR's persistently <60 mL/min signify possible Chronic Kidney Disease.    Anion gap 10 5 - 15    Comment: Performed at [facility] [address] [place], [place] [address]  Magnesium     Status: Abnormal   Collection Time: 06/18/2017  9:03 PM  Result Value Ref Range   Magnesium 1.5 (L) 1.7 - 2.4 mg/dL    Comment: Performed at [facility] [address], [place], [place] [address]  Phosphorus     Status: Abnormal   Collection Time: 06/20/2017  9:03 PM  Result Value Ref Range   Phosphorus 5.9 (H) 2.5 - 4.6 mg/dL    Comment: Performed at [facility] [address] [place], [place] [address]  Lactic acid, plasma     Status: Abnormal   Collection Time: 07/05/2017  9:03 PM  Result Value Ref Range   Lactic Acid, Venous 3.1 (HH) 0.5 - 1.9 mmol/L    Comment: CRITICAL RESULT CALLED TO, READ BACK BY AND VERIFIED WITH: WRIGHT,J RN 07/06/2017 2217 JORDANS Performed at [facility], [place] [address], [place], [place] [address]   Procalcitonin - Baseline     Status: None   Collection Time: 06/21/2017  9:03 PM  Result Value Ref Range   Procalcitonin 1.33 ng/mL  Comment:        Interpretation: PCT > 0.5 ng/mL and <= 2 ng/mL: Systemic infection (sepsis) is possible, but other conditions are  known to elevate PCT as well. (NOTE)       Sepsis PCT Algorithm           Lower Respiratory Tract                                      Infection PCT Algorithm    ----------------------------     ----------------------------         PCT < 0.25 ng/mL                PCT < 0.10 ng/mL         Strongly encourage             Strongly discourage   discontinuation of antibiotics    initiation of antibiotics    ----------------------------     -----------------------------       PCT 0.25 - 0.50 ng/mL            PCT 0.10 - 0.25 ng/mL               OR       >80% decrease in PCT            Discourage initiation of                                            antibiotics      Encourage discontinuation           of antibiotics    ----------------------------     -----------------------------         PCT >= 0.50 ng/mL              PCT 0.26 - 0.50 ng/mL                AND       <80% decrease in PCT             Encourage initiation of                                             antibiotics       Encourage continuation           of antibiotics    ----------------------------     -----------------------------        PCT >= 0.50 ng/mL                  PCT > 0.50 ng/mL               AND         increase in PCT                  Strongly encourage                                      initiation of antibiotics    Strongly encourage escalation           of antibiotics                                     -----------------------------  PCT <= 0.25 ng/mL                                                 OR                                        > 80% decrease in PCT                                     Discontinue / Do not initiate                                             antibiotics Performed at [facility], [place] [address] [place], [place] [address]   CK     Status: Abnormal   Collection Time: 06/26/2017  9:03 PM  Result Value Ref Range   Total CK 1,062 (H)  38 - 234 U/L    Comment: Performed at [facility], [place] [address], [place], [place] [address]  Troponin I (q 6hr x 3)     Status: Abnormal   Collection Time: 07/04/2017  9:03 PM  Result Value Ref Range   Troponin I 0.98 (HH) <0.03 ng/mL    Comment: CRITICAL RESULT CALLED TO, READ BACK BY AND VERIFIED WITH: K.EUBANKS,NP 2253 07/14/2017 G.MCADOO Performed at [facility], [place] [address] [place], [place] [address]   Type and screen [place]     Status: None (Preliminary result)   Collection Time: 07/06/2017  9:20 PM  Result Value Ref Range   ABO/RH(D) O POS    Antibody Screen POS    Sample Expiration 06/20/2017    Antibody Identification ANTI JKA (Kidd a)    Unit Number [id number]    Blood Component Type RED CELLS,LR    Unit division 00    Status of Unit ALLOCATED    Transfusion Status OK TO TRANSFUSE    Crossmatch Result COMPATIBLE    Donor AG Type      NEGATIVE FOR KIDD A ANTIGEN Performed at [facility], [place] [address], [place], [place] [address]   I-STAT, [place] 8     Status: Abnormal   Collection Time: 06/23/2017  9:38 PM  Result Value Ref Range   Sodium 137 135 - 145 mmol/L   Potassium 5.0 3.5 - 5.1 mmol/L   Chloride 102 101 - 111 mmol/L   BUN 22 (H) 6 - 20 mg/dL   Creatinine, Ser 1.10 (H) 0.44 - 1.00 mg/dL   Glucose, Bld 94 65 - 99 mg/dL   Calcium, Ion 0.98 (L) 1.15 - 1.40 mmol/L   TCO2 21 (L) 22 - 32 mmol/L   Hemoglobin 8.8 (L) 12.0 - 15.0 g/dL   HCT 26.0 (L) 36.0 - 46.0 %  Prepare Pheresed Platelets     Status: None   Collection Time: 06/16/2017 10:59 PM  Result Value Ref Range   Unit Number [id number]    Blood Component Type PLTPHER LR1    Unit division 00    Status of Unit REL FROM [facility]  Transfusion Status OK TO TRANSFUSE    Unit Number [id number]    Blood Component Type PLTPHER LR2    Unit division 00    Status of Unit ISSUED,FINAL    Transfusion Status OK TO TRANSFUSE    Unit Number [id number]    Blood  Component Type PLTPHER LRI1    Unit division 00    Status of Unit ISSUED,FINAL    Transfusion Status      OK TO TRANSFUSE Performed at [facility] [address], [place], [place] [address]   Prepare fresh frozen plasma     Status: None   Collection Time: 07/15/2017 10:59 PM  Result Value Ref Range   Unit Number [id number]    Blood Component Type THAWED PLASMA    Unit division 00    Status of Unit REL FROM [facility]    Transfusion Status      OK TO TRANSFUSE Performed at [facility], [place] [address] [place], [place] [address]    Unit Number [id number]    Blood Component Type THAWED PLASMA    Unit division 00    Status of Unit REL FROM [facility]    Transfusion Status OK TO TRANSFUSE   I-STAT 7, (LYTES, BLD GAS, ICA, H+H)     Status: Abnormal   Collection Time: 07/05/2017 11:43 PM  Result Value Ref Range   pH, Arterial 7.252 (L) 7.350 - 7.450   pCO2 arterial 50.9 (H) 32.0 - 48.0 mmHg   pO2, Arterial 282.0 (H) 83.0 - 108.0 mmHg   Bicarbonate 22.4 20.0 - 28.0 mmol/L   TCO2 24 22 - 32 mmol/L   O2 Saturation 100.0 %   Acid-base deficit 5.0 (H) 0.0 - 2.0 mmol/L   Sodium 137 135 - 145 mmol/L   Potassium 5.3 (H) 3.5 - 5.1 mmol/L   Calcium, Ion 0.94 (L) 1.15 - 1.40 mmol/L   HCT 31.0 (L) 36.0 - 46.0 %   Hemoglobin 10.5 (L) 12.0 - 15.0 g/dL   Patient temperature HIDE    Sample type ARTERIAL   I-STAT 3, arterial blood gas (G3+)     Status: Abnormal   Collection Time: 06/26/17  3:59 AM  Result Value Ref Range   pH, Arterial 7.291 (L) 7.350 - 7.450   pCO2 arterial 44.4 32.0 - 48.0 mmHg   pO2, Arterial 187.0 (H) 83.0 - 108.0 mmHg   Bicarbonate 21.4 20.0 - 28.0 mmol/L   TCO2 23 22 - 32 mmol/L   O2 Saturation 100.0 %   Acid-base deficit 5.0 (H) 0.0 - 2.0 mmol/L   Patient temperature 98.7 F    Collection site ARTERIAL LINE    Sample type ARTERIAL   Glucose, capillary     Status: None   Collection Time: 06/26/17  4:28 AM  Result Value Ref Range   Glucose-Capillary 94 65 -  99 mg/dL   Comment 1 Capillary Specimen    Comment 2 Notify RN   Procalcitonin     Status: None   Collection Time: 06/26/17  5:15 AM  Result Value Ref Range   Procalcitonin 1.48 ng/mL    Comment:        Interpretation: PCT > 0.5 ng/mL and <= 2 ng/mL: Systemic infection (sepsis) is possible, but other conditions are known to elevate PCT as well. (NOTE)       Sepsis PCT Algorithm           Lower Respiratory Tract  Infection PCT Algorithm    ----------------------------     ----------------------------         PCT < 0.25 ng/mL                PCT < 0.10 ng/mL         Strongly encourage             Strongly discourage   discontinuation of antibiotics    initiation of antibiotics    ----------------------------     -----------------------------       PCT 0.25 - 0.50 ng/mL            PCT 0.10 - 0.25 ng/mL               OR       >80% decrease in PCT            Discourage initiation of                                            antibiotics      Encourage discontinuation           of antibiotics    ----------------------------     -----------------------------         PCT >= 0.50 ng/mL              PCT 0.26 - 0.50 ng/mL                AND       <80% decrease in PCT             Encourage initiation of                                             antibiotics       Encourage continuation           of antibiotics    ----------------------------     -----------------------------        PCT >= 0.50 ng/mL                  PCT > 0.50 ng/mL               AND         increase in PCT                  Strongly encourage                                      initiation of antibiotics    Strongly encourage escalation           of antibiotics                                     -----------------------------                                           PCT <= 0.25 ng/mL  OR                                        > 80%  decrease in PCT                                     Discontinue / Do not initiate                                             antibiotics Performed at [facility], [place] [address] [place], [place] [address]   Troponin I (q 6hr x 3)     Status: Abnormal   Collection Time: 06/26/17  5:15 AM  Result Value Ref Range   Troponin I 0.30 (HH) <0.03 ng/mL    Comment: CRITICAL VALUE NOTED.  VALUE IS CONSISTENT WITH PREVIOUSLY REPORTED AND CALLED VALUE. Performed at [facility], [place] [address] [place], [place] [address]   CBC     Status: Abnormal   Collection Time: 06/26/17  5:15 AM  Result Value Ref Range   WBC 29.7 (H) 4.0 - 10.5 K/uL   RBC 2.80 (L) 3.87 - 5.11 MIL/uL   Hemoglobin 9.0 (L) 12.0 - 15.0 g/dL   HCT 27.9 (L) 36.0 - 46.0 %   MCV 99.6 78.0 - 100.0 fL   MCH 32.1 26.0 - 34.0 pg   MCHC 32.3 30.0 - 36.0 g/dL   RDW 19.8 (H) 11.5 - 15.5 %   Platelets 144 (L) 150 - 400 K/uL    Comment: DELTA CHECK NOTED POST TRANSFUSION SPECIMEN Performed at [facility], [place] [address], [place], [place] [address]   Cortisol     Status: None   Collection Time: 06/26/17  5:15 AM  Result Value Ref Range   Cortisol, Plasma 20.6 ug/dL    Comment: (NOTE) AM    6.7 - 22.6 ug/dL PM   <10.0       ug/dL Performed at [facility] [address], [place], [place] [address]   Culture, respiratory (NON-Expectorated)     Status: None (Preliminary result)   Collection Time: 06/26/17  5:59 AM  Result Value Ref Range   Specimen Description TRACHEAL ASPIRATE    Special Requests NONE    Gram Stain      ABUNDANT WBC PRESENT,BOTH PMN AND MONONUCLEAR RARE SQUAMOUS EPITHELIAL CELLS PRESENT RARE GRAM NEGATIVE RODS    Culture      CULTURE REINCUBATED FOR BETTER GROWTH Performed at [facility], [place] [address] [place], [place] [address]    Report Status PENDING   Troponin I (q 6hr x 3)     Status: Abnormal   Collection Time: 06/26/17  6:14 AM  Result Value Ref Range    Troponin I 0.31 (HH) <0.03 ng/mL    Comment: CRITICAL VALUE NOTED.  VALUE IS CONSISTENT WITH PREVIOUSLY REPORTED AND CALLED VALUE. Performed at [facility], [place] [address] [place], [place] [address]   Basic metabolic panel     Status: Abnormal   Collection Time: 06/26/17  6:14 AM  Result Value Ref Range   Sodium 134 (L) 135 - 145 mmol/L   Potassium 5.6 (H) 3.5 -  5.1 mmol/L   Chloride 105 101 - 111 mmol/L   CO2 18 (L) 22 - 32 mmol/L   Glucose, Bld 103 (H) 65 - 99 mg/dL   BUN 27 (H) 6 - 20 mg/dL   Creatinine, Ser 1.78 (H) 0.44 - 1.00 mg/dL   Calcium 6.6 (L) 8.9 - 10.3 mg/dL   GFR calc non Af Amer 32 (L) >60 mL/min   GFR calc Af Amer 37 (L) >60 mL/min    Comment: (NOTE) The eGFR has been calculated using the CKD EPI equation. This calculation has not been validated in all clinical situations. eGFR's persistently <60 mL/min signify possible Chronic Kidney Disease.    Anion gap 11 5 - 15    Comment: Performed at [facility] [address] [place], [place] [address]  Magnesium     Status: None   Collection Time: 06/26/17  6:14 AM  Result Value Ref Range   Magnesium 2.0 1.7 - 2.4 mg/dL    Comment: Performed at [facility] [address], [place], [place] [address]  Phosphorus     Status: Abnormal   Collection Time: 06/26/17  6:14 AM  Result Value Ref Range   Phosphorus 6.8 (H) 2.5 - 4.6 mg/dL    Comment: Performed at [facility] [address], [place], [place] [address]  Hepatic function panel     Status: Abnormal   Collection Time: 06/26/17  6:14 AM  Result Value Ref Range   Total Protein 5.7 (L) 6.5 - 8.1 g/dL   Albumin 1.9 (L) 3.5 - 5.0 g/dL   AST 90 (H) 15 - 41 U/L   ALT 35 14 - 54 U/L   Alkaline Phosphatase 164 (H) 38 - 126 U/L   Total Bilirubin 11.4 (H) 0.3 - 1.2 mg/dL   Bilirubin, Direct 6.4 (H) 0.1 - 0.5 mg/dL   Indirect Bilirubin 5.0 (H) 0.3 - 0.9 mg/dL    Comment: Performed at [facility] [address] [place] [address] [place], [place]  [address]  Protime-INR     Status: Abnormal   Collection Time: 06/26/17  6:14 AM  Result Value Ref Range   Prothrombin Time 25.6 (H) 11.4 - 15.2 seconds   INR 2.36     Comment: Performed at [facility] [address] [place], [place] [address]  Fibrinogen     Status: None   Collection Time: 06/26/17  6:14 AM  Result Value Ref Range   Fibrinogen 220 210 - 475 mg/dL    Comment: Performed at [facility] [address] [place], [place] [address]  Lactic acid, plasma     Status: Abnormal   Collection Time: 06/26/17  7:54 AM  Result Value Ref Range   Lactic Acid, Venous 2.6 (HH) 0.5 - 1.9 mmol/L    Comment: CRITICAL RESULT CALLED TO, READ BACK BY AND VERIFIED WITH: K.JENKINS,RN 06/26/17 0904 BY BSLADE Performed at [facility], [place] [address], [place], [place] [address]   Glucose, capillary     Status: Abnormal   Collection Time: 06/26/17  7:57 AM  Result Value Ref Range   Glucose-Capillary 102 (H) 65 - 99 mg/dL   Comment 1 Capillary Specimen    Comment 2 Notify RN   I-STAT 3, arterial blood gas (G3+)     Status: Abnormal   Collection Time: 06/26/17  7:58 AM  Result Value Ref Range   pH, Arterial 7.316 (L) 7.350 - 7.450   pCO2 arterial 37.6 32.0 -  48.0 mmHg   pO2, Arterial 85.0 83.0 - 108.0 mmHg   Bicarbonate 19.2 (L) 20.0 - 28.0 mmol/L   TCO2 20 (L) 22 - 32 mmol/L   O2 Saturation 95.0 %   Acid-base deficit 6.0 (H) 0.0 - 2.0 mmol/L   Patient temperature HIDE    Sample type ARTERIAL   Lactic acid, plasma     Status: Abnormal   Collection Time: 06/26/17 11:18 AM  Result Value Ref Range   Lactic Acid, Venous 2.4 (HH) 0.5 - 1.9 mmol/L    Comment: CRITICAL RESULT CALLED TO, READ BACK BY AND VERIFIED WITH: JOHNSON,M RN @ 1248 06/26/17 LEONARD,A Performed at [facility], [place] [address], [place], [place] [address]   CK     Status: Abnormal   Collection Time: 06/26/17 11:20 AM  Result Value Ref Range   Total CK 910 (H) 38 - 234 U/L    Comment: Performed at  [facility], [place] [address], [place], [place] [address]  CBC     Status: Abnormal   Collection Time: 06/26/17 11:20 AM  Result Value Ref Range   WBC 26.0 (H) 4.0 - 10.5 K/uL   RBC 2.62 (L) 3.87 - 5.11 MIL/uL   Hemoglobin 8.2 (L) 12.0 - 15.0 g/dL   HCT 26.2 (L) 36.0 - 46.0 %   MCV 100.0 78.0 - 100.0 fL   MCH 31.3 26.0 - 34.0 pg   MCHC 31.3 30.0 - 36.0 g/dL   RDW 19.5 (H) 11.5 - 15.5 %   Platelets 94 (L) 150 - 400 K/uL    Comment: PLATELET COUNT CONFIRMED BY SMEAR Performed at [facility] [address] [place], [place] [address]   Glucose, capillary     Status: Abnormal   Collection Time: 06/26/17 11:27 AM  Result Value Ref Range   Glucose-Capillary 112 (H) 65 - 99 mg/dL   Comment 1 Capillary Specimen    Comment 2 Notify RN   Glucose, capillary     Status: Abnormal   Collection Time: 06/26/17  4:00 PM  Result Value Ref Range   Glucose-Capillary 110 (H) 65 - 99 mg/dL   Comment 1 Capillary Specimen   CK     Status: Abnormal   Collection Time: 06/26/17  4:35 PM  Result Value Ref Range   Total CK 1,032 (H) 38 - 234 U/L    Comment: Performed at [facility], [place] [address] [place], [place] [address]  CBC     Status: Abnormal   Collection Time: 06/26/17  4:35 PM  Result Value Ref Range   WBC 29.5 (H) 4.0 - 10.5 K/uL   RBC 2.75 (L) 3.87 - 5.11 MIL/uL   Hemoglobin 8.7 (L) 12.0 - 15.0 g/dL   HCT 27.6 (L) 36.0 - 46.0 %   MCV 100.4 (H) 78.0 - 100.0 fL   MCH 31.6 26.0 - 34.0 pg   MCHC 31.5 30.0 - 36.0 g/dL   RDW 19.4 (H) 11.5 - 15.5 %   Platelets 126 (L) 150 - 400 K/uL    Comment: Performed at [facility], [place] [address] [place], [place] [address]  Glucose, capillary     Status: Abnormal   Collection Time: 06/26/17  7:42 PM  Result Value Ref Range   Glucose-Capillary 110 (H) 65 - 99 mg/dL   Comment 1 Capillary Specimen    Comment 2 Notify RN   CK     Status: Abnormal   Collection Time: 06/26/17 10:46 PM  Result Value Ref Range   Total CK 1,118  (H) 38 - 234 U/L    Comment: Performed at [facility], [place] [address], [place], [place] [address]  CBC     Status: Abnormal   Collection Time: 06/26/17 10:46 PM  Result Value Ref Range   WBC 30.2 (H) 4.0 - 10.5 K/uL   RBC 2.75 (L) 3.87 - 5.11 MIL/uL   Hemoglobin 8.9 (L) 12.0 - 15.0 g/dL   HCT 27.8 (L) 36.0 - 46.0 %   MCV 101.1 (H) 78.0 - 100.0 fL   MCH 32.4 26.0 - 34.0 pg   MCHC 32.0 30.0 - 36.0 g/dL   RDW 19.8 (H) 11.5 - 15.5 %   Platelets 145 (L) 150 - 400 K/uL    Comment: Performed at [facility], [place] [address], [place], [place] [address]  Glucose, capillary     Status: Abnormal   Collection Time: 06/26/17 11:25 PM  Result Value Ref Range   Glucose-Capillary 109 (H) 65 - 99 mg/dL   Comment 1 Capillary Specimen    Comment 2 Notify RN   Procalcitonin     Status: None   Collection Time: 07/05/2017  4:05 AM  Result Value Ref Range   Procalcitonin 3.43 ng/mL    Comment:        Interpretation: PCT > 2 ng/mL: Systemic infection (sepsis) is likely, unless other causes are known. (NOTE)       Sepsis PCT Algorithm           Lower Respiratory Tract                                      Infection PCT Algorithm    ----------------------------     ----------------------------         PCT < 0.25 ng/mL                PCT < 0.10 ng/mL         Strongly encourage             Strongly discourage   discontinuation of antibiotics    initiation of antibiotics    ----------------------------     -----------------------------       PCT 0.25 - 0.50 ng/mL            PCT 0.10 - 0.25 ng/mL               OR       >80% decrease in PCT            Discourage initiation of                                            antibiotics      Encourage discontinuation           of antibiotics    ----------------------------     -----------------------------         PCT >= 0.50 ng/mL              PCT 0.26 - 0.50 ng/mL               AND       <80% decrease in PCT              Encourage initiation  of                                             antibiotics       Encourage continuation           of antibiotics    ----------------------------     -----------------------------        PCT >= 0.50 ng/mL                  PCT > 0.50 ng/mL               AND         increase in PCT                  Strongly encourage                                      initiation of antibiotics    Strongly encourage escalation           of antibiotics                                     -----------------------------                                           PCT <= 0.25 ng/mL                                                 OR                                        > 80% decrease in PCT                                     Discontinue / Do not initiate                                             antibiotics Performed at [facility], [address], [place], [place] [address]   CK     Status: Abnormal   Collection Time: 07/07/2017  4:05 AM  Result Value Ref Range   Total CK 1,474 (H) 38 - 234 U/L    Comment: Performed at [facility], [place] [address], [place], [place] [address]  CBC     Status: Abnormal   Collection Time: 07/01/2017  4:05 AM  Result Value Ref Range   WBC 33.9 (H) 4.0 - 10.5 K/uL    Comment: REPEATED TO VERIFY WHITE COUNT CONFIRMED ON SMEAR    RBC 2.85 (L) 3.87 - 5.11 MIL/uL   Hemoglobin 9.4 (L) 12.0 - 15.0 g/dL   HCT 29.3 (L)  36.0 - 46.0 %   MCV 102.8 (H) 78.0 - 100.0 fL   MCH 33.0 26.0 - 34.0 pg   MCHC 32.1 30.0 - 36.0 g/dL   RDW 20.0 (H) 11.5 - 15.5 %   Platelets 164 150 - 400 K/uL    Comment: Performed at [facility] [address], [place], [place] [address]  Protime-INR     Status: Abnormal   Collection Time: 07/08/2017  4:05 AM  Result Value Ref Range   Prothrombin Time 29.9 (H) 11.4 - 15.2 seconds   INR 2.88     Comment: Performed at [facility] [address] [place], [place] [address]  Basic metabolic panel     Status: Abnormal   Collection  Time: 06/16/2017  4:05 AM  Result Value Ref Range   Sodium 134 (L) 135 - 145 mmol/L   Potassium 5.9 (H) 3.5 - 5.1 mmol/L   Chloride 107 101 - 111 mmol/L   CO2 16 (L) 22 - 32 mmol/L   Glucose, Bld 107 (H) 65 - 99 mg/dL   BUN 39 (H) 6 - 20 mg/dL   Creatinine, Ser 3.52 (H) 0.44 - 1.00 mg/dL    Comment: DELTA CHECK NOTED   Calcium 6.0 (LL) 8.9 - 10.3 mg/dL    Comment: CRITICAL RESULT CALLED TO, READ BACK BY AND VERIFIED WITH: CUMMINGS,B RN 07/11/2017 0523 JORDANS    GFR calc non Af Amer 14 (L) >60 mL/min   GFR calc Af Amer 16 (L) >60 mL/min    Comment: (NOTE) The eGFR has been calculated using the CKD EPI equation. This calculation has not been validated in all clinical situations. eGFR's persistently <60 mL/min signify possible Chronic Kidney Disease.    Anion gap 11 5 - 15    Comment: Performed at [facility] [address], [place], [place] [address]  Magnesium     Status: None   Collection Time: 06/24/2017  4:05 AM  Result Value Ref Range   Magnesium 2.0 1.7 - 2.4 mg/dL    Comment: Performed at [facility] [address], [place], [place] [address]  Phosphorus     Status: Abnormal   Collection Time: 06/22/2017  4:05 AM  Result Value Ref Range   Phosphorus 8.9 (H) 2.5 - 4.6 mg/dL    Comment: Performed at [facility] [address] [place], [place] [address]  Glucose, capillary     Status: Abnormal   Collection Time: 06/17/2017  4:28 AM  Result Value Ref Range   Glucose-Capillary 109 (H) 65 - 99 mg/dL   Comment 1 Capillary Specimen    Comment 2 Notify RN   Glucose, capillary     Status: None   Collection Time: 07/11/2017  8:03 AM  Result Value Ref Range   Glucose-Capillary 70 65 - 99 mg/dL   Comment 1 Capillary Specimen   CK     Status: Abnormal   Collection Time: 07/02/2017 10:18 AM  Result Value Ref Range   Total CK 1,783 (H) 38 - 234 U/L    Comment: Performed at [facility], [place] [address] [place] [address]  CBC     Status: Abnormal    Collection Time: 07/16/2017 10:18 AM  Result Value Ref Range   WBC 33.9 (H) 4.0 - 10.5 K/uL   RBC 2.83 (L) 3.87 - 5.11 MIL/uL   Hemoglobin 9.1 (L) 12.0 - 15.0 g/dL   HCT 30.3 (L) 36.0 - 46.0 %  MCV 107.1 (H) 78.0 - 100.0 fL   MCH 32.2 26.0 - 34.0 pg   MCHC 30.0 30.0 - 36.0 g/dL   RDW 19.7 (H) 11.5 - 15.5 %   Platelets 150 150 - 400 K/uL    Comment: Performed at [facility] [address] [place], [place] [address]  Basic metabolic panel     Status: Abnormal   Collection Time: 06/24/2017 10:18 AM  Result Value Ref Range   Sodium 137 135 - 145 mmol/L   Potassium 6.5 (HH) 3.5 - 5.1 mmol/L    Comment: SLIGHT HEMOLYSIS CRITICAL RESULT CALLED TO, READ BACK BY AND VERIFIED WITH: Mellody Memos RN [id number] 07/03/2017 BY A BENNETT    Chloride 110 101 - 111 mmol/L   CO2 12 (L) 22 - 32 mmol/L   Glucose, Bld 80 65 - 99 mg/dL   BUN 41 (H) 6 - 20 mg/dL   Creatinine, Ser 4.02 (H) 0.44 - 1.00 mg/dL   Calcium 6.1 (LL) 8.9 - 10.3 mg/dL    Comment: CRITICAL RESULT CALLED TO, READ BACK BY AND VERIFIED WITH: Mellody Memos RN [id number] 07/15/2017 BY A BENNETT    GFR calc non Af Amer 12 (L) >60 mL/min   GFR calc Af Amer 14 (L) >60 mL/min    Comment: (NOTE) The eGFR has been calculated using the CKD EPI equation. This calculation has not been validated in all clinical situations. eGFR's persistently <60 mL/min signify possible Chronic Kidney Disease.    Anion gap 15 5 - 15    Comment: Performed at [facility] [address] [place], [place] [address]  Ammonia     Status: Abnormal   Collection Time: 06/23/2017 10:42 AM  Result Value Ref Range   Ammonia 92 (H) 9 - 35 umol/L    Comment: Performed at [facility], [place] [address], [place], [place] [address]  Prepare fresh frozen plasma     Status: None (Preliminary result)   Collection Time: 06/24/2017 10:51 AM  Result Value Ref Range   Unit Number [id number]    Blood Component Type THAWED PLASMA    Unit division 00    Status of Unit ISSUED     Transfusion Status OK TO TRANSFUSE    Unit Number [id number]    Blood Component Type THAWED PLASMA    Unit division 00    Status of Unit ISSUED    Transfusion Status      OK TO TRANSFUSE Performed at [facility] [address], [place], [place] [address]   Glucose, capillary     Status: None   Collection Time: 07/14/2017 11:42 AM  Result Value Ref Range   Glucose-Capillary 67 65 - 99 mg/dL   Comment 1 Capillary Specimen      ROS:  Review of systems not obtained due to patient factors.  Physical Exam: Vitals:   07/14/2017 1330 06/18/2017 1335  BP:    Pulse:  (!) 132  Resp: 16 (!) 6  Temp: 98.6 F (37 C)   SpO2: 100% 96%     General: intubated , sedated HEENT: PERRLA, EOMI Neck: positive for JVD Heart: tachy Lungs: CBS bilat Abdomen: abdominal wall edema- dec BS Extremities: pitting edema- left leg with wound vac Skin: warm and dry  Neuro: sedated   Assessment/Plan: 52 year old female with history of cirrhosis- not candidate right now for liver transplant due to still drinking- now with constellation of recent issues, arterial clot to leg, bacteremia ,  endocarditis and multisystem organ failure.  Baseline crt normal 1.Renal- essentially anuric - hypotensive on pressors- seems like ATN causing AKI- now with hyperkalemia and elevated CVP that likely will not resolve without dialysis support.  She would need CRRT.  Did discuss that prognosis is poor but early enough in course that it is not unreasonable to consider full support in the short term to see if things will improve. I discussed with husband and son.  Do want to give trial of CRRT- CCM to place line- will put orders in 2. Hypertension/volume  - CVP 28 possibly real- requiring FIo2 of 50%- will probably not tolerate much more volume  3. Anemia  - situational after surgery- required transfusion hgb currently above 9 4. [place]- hyperkalemia- has been treated medically - dont suspect it will last with acidosis -  only was to really definitively treat with be with CRRT -  To start will use pre and post bicarb and 4 K dialysate and follow labs    Olukemi Panchal A 07/06/2017, 1:43 PM

## 2017-06-27 NOTE — Progress Notes (Signed)
Advanced per CXR order- NP Nehemiah SettleBrooke

## 2017-06-27 NOTE — Procedures (Signed)
Hemodialysis Catheter Insertion Procedure Note Whitney Mathis [id number] 03-06-1966  Procedure: Insertion of Hemodialysis Catheter Indications: Hemodialysis  Procedure Details Consent: Risks of procedure as well as the alternatives and risks of each were explained to the (patient/caregiver).  Consent for procedure obtained.  Time Out: Verified patient identification, verified procedure, site/side was marked, verified correct patient position, special equipment/implants available, medications/allergies/relevent history reviewed, required imaging and test results available.  Performed  Maximum sterile technique was used including antiseptics, cap, gloves, gown, hand hygiene, mask and sheet.  Skin prep: Chlorhexidine; local anesthetic administered  A Trialysis HD catheter was placed in the left internal jugular vein using the Seldinger technique.  Biopatch applied, sterile dressing in place.   Evaluation Blood flow good Complications: No apparent complications Patient did tolerate procedure well. Chest X-ray ordered to verify placement.  CXR: pending.   Procedure performed under direct supervision of Dr. Delton CoombesByrum and with ultrasound guidance for real time vessel cannulation.     Canary BrimBrandi Ollis, NP-C [facility] Pgr: [phone] or [phone] 07/03/2017, 3:57 PM

## 2017-06-27 NOTE — Progress Notes (Signed)
eLink Physician-Brief Progress Note Patient Name: Whitney Mathis DOB: 04-01-1966 MRN: [id number]   Date of Service  March 29, 2018  HPI/Events of Note  Ca++ = 6.0, PO4--- = 8.9, K+ = 5.9 and Creatinine = 3.52. Ca++ x PO4--- = 54.0.  eICU Interventions  Will order: 1. Replace Ca++  2. Repeat BMP at 12 noon.     Intervention Category Major Interventions: Electrolyte abnormality - evaluation and management  Willeen Novak Eugene March 29, 2018, 5:26 AM

## 2017-06-27 NOTE — Progress Notes (Addendum)
[facility] Progress Note  Kayce Holton 52 y.o.[place] Hals 11-12-1965  CC:  GI bleed, history of cirrhosis   Subjective: Patient remains critically ill. Currently on pressor support. Discussed with the nursing staff. No reported evidence of melena or hematochezia this morning. Hemoglobin 9.1  ROS : Not able to obtain   Objective: Vital signs in last 24 hours: Vitals:   07/04/2017 1000 06/26/2017 1100  BP: (!) 73/50 94/60  Pulse:    Resp: (!) 0 (!) 1  Temp:  100.1 F (37.8 C)  SpO2:      Physical Exam:  Gen. Critically ill-appearing patient. Intubated, sedated Abdomen. Distended., Not able to appreciate tenderness, or sounds present. Lungs. Coarse breath sounds bilaterally. Cardiology. Tachycardia.  Lab Results: Recent Labs    06/26/17 0614 06/26/2017 0405 07/15/2017 1018  NA 134* 134* 137  K 5.6* 5.9* 6.5*  CL 105 107 110  CO2 18* 16* 12*  GLUCOSE 103* 107* 80  BUN 27* 39* 41*  CREATININE 1.78* 3.52* 4.02*  CALCIUM 6.6* 6.0* 6.1*  MG 2.0 2.0  --   PHOS 6.8* 8.9*  --    Recent Labs    April 09, 2018 1203 06/26/17 0614  AST 76* 90*  ALT 31 35  ALKPHOS 168* 164*  BILITOT 11.1* 11.4*  PROT 6.0* 5.7*  ALBUMIN 1.9* 1.9*   Recent Labs    April 09, 2018 1203  April 09, 2018 2103  06/16/2017 0405 07/04/2017 1018  WBC 25.1*  --  22.0*   < > 33.9* 33.9*  NEUTROABS 21.1*  --  18.7*  --   --   --   HGB 9.9*   < > 8.5*   < > 9.4* 9.1*  HCT 30.1*   < > 25.9*   < > 29.3* 30.3*  MCV 95.9  --  97.4   < > 102.8* 107.1*  PLT 67*  --  53*   < > 164 150   < > = values in this interval not displayed.   Recent Labs    06/26/17 0614 07/08/2017 0405  LABPROT 25.6* 29.9*  INR 2.36 2.88      Assessment/Plan: - Decompensated alcoholic cirrhosis. 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 Currently intubated - Possible bacterial endocarditis - Acute kidney injury - Electrolyte disturbance, calcium 6.1, potassium 6.5 - Septic shock. Currently on  pressor support - Left lower extent ischemia. S/P embolectomy and 4 compartment fasciotomy early this morning.  Recommendations -------------------------- -  patient with multiple medical comorbidities. Possible bacterial endocarditis and septic shock currently on pressor support. INR trending upwards. 2.88 today. Not a candidate for FFP because of recent embolic event causing left lower extremity ischemia. - Remains hypotensive .  Tmax 102.9  - Hemoglobin relatively stable. Received 5 mg vitamin K yesterday. Will give another 5 mg vitamin K today  - Tentatively plan for EGD tomorrow depending on overall clinical condition. Patient is at high-risk for bleeding particularly when attempting band ligation. If she starts bleeding while attempting band ligation, it may deteriorate  her overall clinical outcome. She may not be a candidate for TIPS because of decompensated cirrhosis with a meld score of 40. - Discuss with the patient at bedside. Poor prognosis also discussed. - Not a candidate for liver transplant because of ongoing alcohol use.  Kathi DerParag Alphonsa Brickle MD, FACP 07/12/2017, 11:29 AM  Contact #  [phone]

## 2017-06-27 NOTE — Progress Notes (Signed)
Patient ID: Whitney Mathis, female   DOB: 1965-08-11, 52 y.o.   MRN: [id number] Remains critically ill.  Pressure 80-90 systolic on several pressors. Having some oozing from fasciotomy sites.  No evidence of significant bleeding. Some duskiness and toes on the left versus the right.  Audible Doppler flow into the foot.  No evidence of left groin issues. We will plan return to the operating room tomorrow for fasciotomy washout closure and replacement of VAC if closure not possible. If she remains hemodynamically unstable can do VAC change at the bedside.

## 2017-06-27 NOTE — Progress Notes (Signed)
PULMONARY / CRITICAL CARE MEDICINE   Name: Whitney Mathis MRN: [id number] DOB: [birth year]    ADMISSION DATE:  28-Jun-2017 CONSULTATION DATE:  06/19/2017  REFERRING MD:  Hyacinth Meeker   CHIEF COMPLAINT:  Left Leg Ischemia   HISTORY OF PRESENT ILLNESS:   52 year old female with PMH of ETOH, Liver Cirrhosis, Tobacco use  Presents to [facility] 3/10 with left lung numbness that began earlier this morning. Presents to ED with dyspnea, tachycardia, and melena. WBC 25.1. Found to have no pulses below the knee of left side. Vascular consulted. While in the ED patient went into respiratory distress requiring intubation Transferred to [facility] for further work-up.   Upon arrival to [facility] patient HR 150-160, Systolic 90-100. Vascular at bedside with plans to take patient emergently to OR. While in unit patient became hypotensive requiring central line placement and initiation of neo and vasopressin gtt.    SUBJECTIVE / Interval Events:  POD #2 status post left lower extremity femoral embolectomy and 4 compartment fasciotomy, apparent embolic thrombus removed Significant increase in pressor needs last 24 hours Evolving renal failure, now anuric   VITAL SIGNS: BP (!) 87/55   Pulse 95   Temp 99.2 F (37.3 C) (Rectal)   Resp (!) 6   Ht 5\' 4"  (1.626 m)   Wt 92.2 kg (203 lb 4.2 oz)   SpO2 100%   BMI 34.89 kg/m   HEMODYNAMICS: CVP:  [18 mmHg-28 mmHg] 28 mmHg  VENTILATOR SETTINGS: Vent Mode: PRVC FiO2 (%):  [50 %] 50 % Set Rate:  [20 bmp] 20 bmp Vt Set:  [450 mL] 450 mL PEEP:  [5 cmH20] 5 cmH20 Plateau Pressure:  [17 cmH20-20 cmH20] 20 cmH20  INTAKE / OUTPUT: I/O last 3 completed shifts: In: 9857 [I.V.:7478; Blood:469; IV Piggyback:1910] Out: 400 [Urine:75; Drains:250; Blood:75]  PHYSICAL EXAMINATION: General: Critically ill-appearing woman, intubated, sedated Neuro: Does not wake to voice, completely obtunded HEENT: Endotracheal tube in place with blood-tinged secretions coming  from her ET tube and suctioning Cardiovascular: Tachycardic, distant, no murmur Lungs: Coarse bilaterally, no wheeze Abdomen: Distended, soft, positive bowel sounds Musculoskeletal: Left groin dressing clean and dry, oozing from her fasciotomy site left lower extremity.  Wound VAC in place Skin: Jaundice present  LABS:  BMET Recent Labs  Lab 07/09/2017 2103 28-Jun-2017 2138 06/17/2017 2343 06/26/17 0614 07/16/2017 0405  NA 133* 137 137 134* 134*  K 5.1 5.0 5.3* 5.6* 5.9*  CL 105 102  --  105 107  CO2 18*  --   --  18* 16*  BUN 23* 22*  --  27* 39*  CREATININE 1.20* 1.10*  --  1.78* 3.52*  GLUCOSE 93 94  --  103* 107*    Electrolytes Recent Labs  Lab 06/19/2017 2103 06/26/17 0614 07/09/2017 0405  CALCIUM 6.8* 6.6* 6.0*  MG 1.5* 2.0 2.0  PHOS 5.9* 6.8* 8.9*    CBC Recent Labs  Lab 06/26/17 1635 06/26/17 2246 07/15/2017 0405  WBC 29.5* 30.2* 33.9*  HGB 8.7* 8.9* 9.4*  HCT 27.6* 27.8* 29.3*  PLT 126* 145* 164    Coag's Recent Labs  Lab Jun 28, 2017 1203 06/26/17 0614 07/16/2017 0405  INR 1.96 2.36 2.88    Sepsis Markers Recent Labs  Lab June 28, 2017 2103 06/26/17 0515 06/26/17 0754 06/26/17 1118 06/16/2017 0405  LATICACIDVEN 3.1*  --  2.6* 2.4*  --   PROCALCITON 1.33 1.48  --   --  3.43    ABG Recent Labs  Lab 06-28-17 2343 06/26/17 0359 06/26/17 0758  PHART 7.252* 7.291* 7.316*  PCO2ART 50.9* 44.4 37.6  PO2ART 282.0* 187.0* 85.0    Liver Enzymes Recent Labs  Lab 06/26/2017 1203 06/26/17 0614  AST 76* 90*  ALT 31 35  ALKPHOS 168* 164*  BILITOT 11.1* 11.4*  ALBUMIN 1.9* 1.9*    Cardiac Enzymes Recent Labs  Lab 07/15/2017 2103 06/26/17 0515 06/26/17 0614  TROPONINI 0.98* 0.30* 0.31*    Glucose Recent Labs  Lab 06/26/17 1127 06/26/17 1600 06/26/17 1942 06/26/17 2325 07/15/2017 0428 07/14/2017 0803  GLUCAP 112* 110* 110* 109* 109* 70    Imaging Dg Chest Port 1 View  Result Date: 07/08/2017 CLINICAL DATA:  52 year old female with acute  respiratory failure and shortness breath. Subsequent encounter. EXAM: PORTABLE CHEST 1 VIEW COMPARISON:  06/26/2017 chest x-ray. FINDINGS: Endotracheal tube tip 3.6 cm above the carina. Right central line tip projects at the level of the proximal superior vena cava. Progressive prominent diffuse airspace disease may represent pulmonary edema. Infection not excluded in proper clinical setting. Cardiomegaly. Poor delineation of mediastinal structures although grossly unchanged. No obvious pneumothorax. IMPRESSION: Progressive prominent airspace disease approaching ARDS pattern. This may represent pulmonary edema although infection not excluded in proper clinical setting. Cardiomegaly. Electronically Signed   By: Lacy DuverneySteven  Olson M.D.   On: 06/16/2017 08:43     STUDIES:  CTA 3/10 > 1. Cirrhotic liver. Associated splenomegaly and upper abdominal varices. Associated small amount of ascites in the abdomen and pelvis. 2. Cholelithiasis without evidence of acute cholecystitis. 3. Masslike consolidation within the right lower lung, lateral aspects of the right lower lobe abutting the pleura, measuring 4.7 cm greatest dimension, most likely atelectasis. Recommend follow-up chest CT at some point to ensure resolution. 4. No acute findings within the lumbar spine. Mild scoliosis. Mild degenerative change within the lumbar spine, as detailed above. No large disc bulge or protrusion seen. 5. Anasarca. 6. Aortic atherosclerosis. CXR 3/10 > Endotracheal tube seen ending 2-3 cm above the carina.Lungs remain hypoexpanded. Patchy bilateral airspace opacities are improved from the recent prior study and may reflect mild pulmonary edema or pneumonia. Borderline cardiomegaly.  CULTURES: Blood 3/10 >> 2 of 2 coag negative staph >>  Urine 3/10 >>  Resp 3/10  >> rare GNR >>   ANTIBIOTICS: Rocephin 3/10  Vancomycin 3/11 >> Zosyn 3/11 >>    SIGNIFICANT EVENTS: 3/10 > Presents to OSH  3/10 > Status post left lower  extremity femoral embolectomy and 4 compartment fasciotomy  LINES/TUBES: ETT 3/10 >> RIJ 3/10 >>   DISCUSSION: 52 year old female presents to ED with reported left leg numbness for the last few days.  Noted to have possible UTI, gram-positive bacteremia, evolving shock and respiratory failure.  Vascular was consulted as patient was found to have no pulses below the knee.   ASSESSMENT / PLAN:  PULMONARY A: Acute Hypoxic Respiratory Failure  Masslike consolidation within the right lower lung, lateral aspects of the right lower lobe abutting the pleura, measuring 4.7 H/O Tobacco Use  Bilateral pulmonary infiltrates, evolving ARDS Hemoptysis P:   Continue PRBC, ARDS protocol as she can tolerate.  Consider a transition to pressure control if unable to tolerate low tidal volume ventilation Consider bronchoscopy to evaluate for diffuse alveolar hemorrhage, source of hemoptysis Bronchodilators as needed Follow chest x-ray Defer workup for right lower lobe mass at this time  CARDIOVASCULAR A:  Hypotension in setting of hypovolemia, hemorraghic, or septic shock  New A.Fib  Ischemic Left Foot, status post left femoral embolectomy and fasciotomy on 3/10 [place]  mass noted on mitral valve, suspicious for endocarditis, associated MR P:  Add norepinephrine and try to transition off of phenylephrine Continue vasopressin for now Left femoral wound care Appreciate vascular surgery assistance, possible closure of her left lower extremity fasciotomy 3/13; unclear whether she will be a good candidate for surgical procedure given her overall decline in critical illness we will need to assess at that time TEE when she is stable to undergo, suspect infectious endocarditis with coag negative staph Continue stress dose steroids Defer anticoagulation at this time due to her coagulopathy  RENAL A:   Anion Gap Metabolic Acidosis with Lactic Acidosis, clearing Hypomagnesemia, improved P:   Continue to  follow BMP, urine output Consult nephrology for evaluation 3/12 Maintenance fluids 50 cc/h   GASTROINTESTINAL A:   Alcoholic cirrhosis with evidence for active GI blood loss +Hematemesis and Hematochezia   Esophageal Varices  P:   Continues to have evidence for GI blood loss.  Appreciate gastroenterology assistance.  EGD deferred until her coagulopathy is improved, suspect that she will need variceal banding Continue octreotide as ordered Continue Protonix as ordered Follow ammonia given her encephalopathy; start lactulose 3/12 Vitamin K as below  HEMATOLOGIC A:   Acute on chronic Anemia, acute GI blood loss  +Hematemesis and Hematochezia Thrombocytopenia  Hepatic coagulopathy P:  Follow serial CBCs Transfusion goal hemoglobin 7-8 given the potential for active bleeding Gastroenterology evaluation as above Holding all anticoagulants at this time given her coagulopathy, need to consider a potential start date given her lower extremity embolic phenomenon Vitamin K again on 3/12 To follow INR  INFECTIOUS A:   Bacteremia > 2 of 2 coag negative staph, sensitivities pending U/A with many bacteria  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 P:   Continue Zosyn, vancomycin stopped on 3/11 given culture data Continue to follow cultures  TEE when the patient stable undergo  ENDOCRINE A:   No issues    P:   Following CBG Restart stress dose steroids on 3/12  NEUROLOGIC A:   ETOH withdrawal  H/O ETOH Abuse  Hepatic encephalopathy P:   RASS goal: 0/-1 Stop Precedex, continue fentanyl drip Add Versed as needed Lactulose Continue folic acid, thiamine   FAMILY  - Updates:  - Inter-disciplinary family meet or Palliative Care meeting due by:  07/02/2017  Independent critical care time 40 minutes  Levy Pupa, MD, PhD 06/21/2017, 10:17 AM [facility] [phone] or if no answer [phone]

## 2017-06-27 NOTE — Progress Notes (Signed)
   07/04/2017 2135  Clinical Encounter Type  Visited With Patient and family together;Health care provider  Visit Type Critical Care  Referral From Nurse  Consult/Referral To Chaplain  Spiritual Encounters  Spiritual Needs Emotional;Prayer;Sacred text  Stress Factors  Family Stress Factors Health changes   Nurse for this patient had me paged to come support the family.  Arriving in the room I met the husband Dexter.  Michela Pitcher they have been married 22 years.  He stated they went to [facility] on Sunday and were brought here for surgery and she has not gotten better.  Patient and spouse have family and spiritual support.  Husband indicated he wish he had his Bible, so I was able to get one for him.  We prayed together.  I let him know I was here all night if there is anything I can do for him and said the same to the nurse.  Will follow and support as needed. Chaplain Katherene Ponto

## 2017-06-27 NOTE — Progress Notes (Signed)
Pt sedated, on multiple drips... .Marland Kitchen

## 2017-06-27 NOTE — Progress Notes (Signed)
eLink Physician-Brief Progress Note Patient Name: Whitney Mathis DOB: [birth year] MRN: [id number]   Date of Service  06/22/2017  HPI/Events of Note  Hypotension/ Hypoxemia/ hypercapnia /hypocalcemia  eICU Interventions  Add epinephrine infusion Increase resp rate to 32 Increase PEEP to 9 and monitor BP Check ionized calcium Iv Calcium gluconate 1 gm pending result of Ca2+ check Interval ABG        Thomasene Lotkoronkwo U [name] 07/13/2017, 9:28 PM

## 2017-06-27 NOTE — Progress Notes (Signed)
CRITICAL VALUE ALERT  Critical Value:  K+ 65 and Ca 6.1  Date & Time Notied:  06/23/2017 at 11:12  Provider Notified: Dr. Delton CoombesByrum  Orders Received/Actions taken: awaiting

## 2017-06-27 NOTE — Progress Notes (Signed)
CRITICAL VALUE ALERT  Critical Value:  Calcium 5.8  Date & Time Notied: 07/03/2017 2030  Provider Notified: Warrick Parisiangan MD  Orders Received/Actions taken: see [facility]

## 2017-06-27 NOTE — Progress Notes (Signed)
RT note- Patient RR increased post ABG  results

## 2017-06-27 NOTE — Progress Notes (Signed)
CRITICAL VALUE ALERT  Critical Value:  Calcium 6.0  Date & Time Notied:  07/09/2017 5:26 AM   Provider Notified: Dr. Arsenio LoaderSommer  Orders Received/Actions taken: MD to place orders

## 2017-06-27 NOTE — Progress Notes (Signed)
Initial Nutrition Assessment  DOCUMENTATION CODES:   Obesity unspecified  INTERVENTION:   When GI status allows, recommend start TF:  Vital High Protein at 65 ml/h (1560 ml per day)  Provides 1560 kcal, 136.5 gm protein, 1304 ml free water daily  NUTRITION DIAGNOSIS:   Inadequate oral intake related to inability to eat as evidenced by NPO status.  GOAL:   Provide needs based on ASPEN/SCCM guidelines  MONITOR:   Vent status, Skin, Labs, I & O's  REASON FOR ASSESSMENT:   Ventilator    ASSESSMENT:   52 yo female with PMH of ETOH abuse, liver cirrhosis, tobacco use who was admitted on 3/10 with L leg ischemia. S/P LLE femoral embolectomy and 4 compartment fasciotomy on 3/11.  Discussed patient in ICU rounds and with RN. Plans for EGD tomorrow depending on overall clinical status. She may not be a candidate for TIPS procedure. Esophageal varices banding is on hold due to high risk for bleeding during the procedure. Nephrology following with plans to begin CRRT. Patient is now anuric. Poor prognosis noted with MSOF. She has a mass-like consolidation in the R lower lung. Cortrak tube at bedside for MD to place.   Patient is currently intubated on ventilator support MV: 8.2 L/min Temp (24hrs), Avg:100 F (37.8 C), Min:98.6 F (37 C), Max:102.9 F (39.4 C)    Labs reviewed. Potassium 6.5 (H) CBG: 70-67 Medications reviewed and include folic acid, lactulose, thiamine, octreotide.  Weight is trending up due to fluid retention. CRRT being initiated today when HD catheter is placed.   NUTRITION - FOCUSED PHYSICAL EXAM:    Most Recent Value  Orbital Region  No depletion  Upper Arm Region  No depletion  Thoracic and Lumbar Region  Unable to assess  Buccal Region  Unable to assess  Temple Region  No depletion  Clavicle Bone Region  No depletion  Clavicle and Acromion Bone Region  No depletion  Scapular Bone Region  Unable to assess  Dorsal Hand  Unable to assess   Patellar Region  No depletion  Anterior Thigh Region  No depletion  Posterior Calf Region  No depletion  Edema (RD Assessment)  Mild  Hair  Reviewed  Eyes  Unable to assess  Mouth  Unable to assess  Skin  Reviewed  Nails  Unable to assess       Diet Order:  Diet NPO time specified Diet NPO time specified Except for: Sips with Meds  EDUCATION NEEDS:   No education needs have been identified at this time  Skin:  Skin Assessment: Skin Integrity Issues: Skin Integrity Issues:: Incisions Incisions: L groin; VAC to L leg   Last BM:  3/10  Height:   Ht Readings from Last 1 Encounters:  07/07/2017 5\' 4"  (1.626 m)    Weight:   Wt Readings from Last 1 Encounters:  07/03/2017 203 lb 4.2 oz (92.2 kg)  Admission weight 142 lbs--64.411 kg (3/10)  Ideal Body Weight:  54.5 kg  BMI:  24.4 (using admission weight of 142 lbs)  Estimated Nutritional Needs:   Kcal:  1550  Protein:  120-135 gm  Fluid:  1.6 L    Joaquin CourtsKimberly Keelan Tripodi, RD, LDN, CNSC Pager [phone] After Hours Pager [phone]

## 2017-06-28 ENCOUNTER — Encounter (HOSPITAL_COMMUNITY): Admission: EM | Disposition: E | Payer: Self-pay | Source: Home / Self Care | Attending: Critical Care Medicine

## 2017-06-28 DIAGNOSIS — N170 Acute kidney failure with tubular necrosis: Secondary | ICD-10-CM

## 2017-06-28 DIAGNOSIS — K7201 Acute and subacute hepatic failure with coma: Secondary | ICD-10-CM

## 2017-06-28 DIAGNOSIS — J9600 Acute respiratory failure, unspecified whether with hypoxia or hypercapnia: Secondary | ICD-10-CM

## 2017-06-28 LAB — GLUCOSE, CAPILLARY
GLUCOSE-CAPILLARY: 74 mg/dL (ref 65–99)
GLUCOSE-CAPILLARY: 60 mg/dL — AB (ref 65–99)
GLUCOSE-CAPILLARY: 68 mg/dL (ref 65–99)
GLUCOSE-CAPILLARY: 86 mg/dL (ref 65–99)
GLUCOSE-CAPILLARY: 69 mg/dL (ref 65–99)
GLUCOSE-CAPILLARY: 82 mg/dL (ref 65–99)
Glucose-Capillary: 79 mg/dL (ref 65–99)
Glucose-Capillary: 88 mg/dL (ref 65–99)
Glucose-Capillary: 88 mg/dL (ref 65–99)
Glucose-Capillary: 73 mg/dL (ref 65–99)
Glucose-Capillary: 67 mg/dL (ref 65–99)

## 2017-06-28 LAB — CBC
HEMATOCRIT: 25.3 % — AB (ref 36.0–46.0)
HEMATOCRIT: 22.7 % — AB (ref 36.0–46.0)
HEMOGLOBIN: 7 g/dL — AB (ref 12.0–15.0)
Hemoglobin: 7.5 g/dL — ABNORMAL LOW (ref 12.0–15.0)
MCH: 31.1 pg (ref 26.0–34.0)
MCH: 32.1 pg (ref 26.0–34.0)
MCHC: 29.6 g/dL — AB (ref 30.0–36.0)
MCHC: 30.8 g/dL (ref 30.0–36.0)
MCV: 105 fL — AB (ref 78.0–100.0)
MCV: 104.1 fL — ABNORMAL HIGH (ref 78.0–100.0)
Platelets: 88 10*3/uL — ABNORMAL LOW (ref 150–400)
Platelets: 69 10*3/uL — ABNORMAL LOW (ref 150–400)
RBC: 2.41 MIL/uL — ABNORMAL LOW (ref 3.87–5.11)
RBC: 2.18 MIL/uL — AB (ref 3.87–5.11)
RDW: 19.8 % — AB (ref 11.5–15.5)
RDW: 20.4 % — ABNORMAL HIGH (ref 11.5–15.5)
WBC: 25.5 10*3/uL — ABNORMAL HIGH (ref 4.0–10.5)
WBC: 18.3 10*3/uL — ABNORMAL HIGH (ref 4.0–10.5)

## 2017-06-28 LAB — BPAM FFP
BLOOD PRODUCT EXPIRATION DATE: 201903152359
Blood Product Expiration Date: 201903152359
ISSUE DATE / TIME: 201903121243
ISSUE DATE / TIME: 201903121332
UNIT TYPE AND RH: 5100
Unit Type and Rh: 5100

## 2017-06-28 LAB — RENAL FUNCTION PANEL
ANION GAP: 24 — AB (ref 5–15)
Albumin: 2 g/dL — ABNORMAL LOW (ref 3.5–5.0)
BUN: 18 mg/dL (ref 6–20)
CALCIUM: 5.2 mg/dL — AB (ref 8.9–10.3)
CO2: 25 mmol/L (ref 22–32)
Chloride: 84 mmol/L — ABNORMAL LOW (ref 101–111)
Creatinine, Ser: 2.46 mg/dL — ABNORMAL HIGH (ref 0.44–1.00)
GFR calc Af Amer: 25 mL/min — ABNORMAL LOW (ref >60)
GFR calc non Af Amer: 22 mL/min — ABNORMAL LOW (ref >60)
GLUCOSE: 61 mg/dL — AB (ref 65–99)
POTASSIUM: 5.5 mmol/L — AB (ref 3.5–5.1)
Phosphorus: 7.3 mg/dL — ABNORMAL HIGH (ref 2.5–4.6)
SODIUM: 133 mmol/L — AB (ref 135–145)

## 2017-06-28 LAB — PREPARE FRESH FROZEN PLASMA
Unit division: 0
Unit division: 0

## 2017-06-28 LAB — CULTURE, RESPIRATORY W GRAM STAIN

## 2017-06-28 LAB — POCT I-STAT 3, ART BLOOD GAS (G3+)
ACID-BASE DEFICIT: 4 mmol/L — AB (ref 0.0–2.0)
ACID-BASE DEFICIT: 1 mmol/L (ref 0.0–2.0)
BICARBONATE: 23.5 mmol/L (ref 20.0–28.0)
Bicarbonate: 25.3 mmol/L (ref 20.0–28.0)
O2 SAT: 91 %
O2 Saturation: 95 %
PCO2 ART: 51.2 mmHg — AB (ref 32.0–48.0)
PO2 ART: 82 mmHg — AB (ref 83.0–108.0)
Patient temperature: 95.9
Patient temperature: 97.2
TCO2: 25 mmol/L (ref 22–32)
TCO2: 27 mmol/L (ref 22–32)
pCO2 arterial: 47.5 mmHg (ref 32.0–48.0)
pH, Arterial: 7.262 — ABNORMAL LOW (ref 7.350–7.450)
pH, Arterial: 7.33 — ABNORMAL LOW (ref 7.350–7.450)
pO2, Arterial: 65 mmHg — ABNORMAL LOW (ref 83.0–108.0)

## 2017-06-28 LAB — BASIC METABOLIC PANEL
Anion gap: 21 — ABNORMAL HIGH (ref 5–15)
BUN: 27 mg/dL — AB (ref 6–20)
CHLORIDE: 94 mmol/L — AB (ref 101–111)
CO2: 21 mmol/L — ABNORMAL LOW (ref 22–32)
Calcium: 6 mg/dL — CL (ref 8.9–10.3)
Creatinine, Ser: 3.16 mg/dL — ABNORMAL HIGH (ref 0.44–1.00)
GFR calc Af Amer: 18 mL/min — ABNORMAL LOW (ref >60)
GFR calc non Af Amer: 16 mL/min — ABNORMAL LOW (ref >60)
GLUCOSE: 77 mg/dL (ref 65–99)
POTASSIUM: 5.2 mmol/L — AB (ref 3.5–5.1)
Sodium: 136 mmol/L (ref 135–145)

## 2017-06-28 LAB — CULTURE, BLOOD (ROUTINE X 2)
Special Requests: ADEQUATE
Special Requests: ADEQUATE

## 2017-06-28 LAB — HEPATIC FUNCTION PANEL
ALT: 487 U/L — AB (ref 14–54)
AST: 4152 U/L — AB (ref 15–41)
Albumin: 2.1 g/dL — ABNORMAL LOW (ref 3.5–5.0)
Alkaline Phosphatase: 162 U/L — ABNORMAL HIGH (ref 38–126)
BILIRUBIN DIRECT: 6 mg/dL — AB (ref 0.1–0.5)
BILIRUBIN TOTAL: 11.7 mg/dL — AB (ref 0.3–1.2)
Indirect Bilirubin: 5.7 mg/dL — ABNORMAL HIGH (ref 0.3–0.9)
Total Protein: 5.5 g/dL — ABNORMAL LOW (ref 6.5–8.1)

## 2017-06-28 LAB — MAGNESIUM: Magnesium: 1.9 mg/dL (ref 1.7–2.4)

## 2017-06-28 LAB — CULTURE, RESPIRATORY: CULTURE: NORMAL

## 2017-06-28 LAB — PROTIME-INR
INR: 5.48
Prothrombin Time: 49.5 seconds — ABNORMAL HIGH (ref 11.4–15.2)

## 2017-06-28 LAB — PHOSPHORUS: Phosphorus: 8 mg/dL — ABNORMAL HIGH (ref 2.5–4.6)

## 2017-06-28 LAB — LACTIC ACID, PLASMA: LACTIC ACID, VENOUS: 13 mmol/L — AB (ref 0.5–1.9)

## 2017-06-28 LAB — AMMONIA: Ammonia: 85 umol/L — ABNORMAL HIGH (ref 9–35)

## 2017-06-28 SURGERY — APPLICATION, WOUND VAC
Anesthesia: Choice | Laterality: Left

## 2017-06-28 SURGERY — EGD (ESOPHAGOGASTRODUODENOSCOPY)
Anesthesia: Moderate Sedation

## 2017-06-28 MED ORDER — ALBUMIN HUMAN 5 % IV SOLN
INTRAVENOUS | Status: AC
  Filled 2017-06-28: qty 250

## 2017-06-28 MED ORDER — DEXTROSE 50 % IV SOLN
INTRAVENOUS | Status: AC
  Administered 2017-06-28: 25 mL via INTRAVENOUS
  Filled 2017-06-28: qty 50

## 2017-06-28 MED ORDER — ORAL CARE MOUTH RINSE
15.0000 mL | OROMUCOSAL | Status: DC
  Administered 2017-06-28 – 2017-06-30 (×15): 15 mL via OROMUCOSAL

## 2017-06-28 MED ORDER — SODIUM CHLORIDE 0.9 % IV SOLN
2.0000 g | Freq: Once | INTRAVENOUS | Status: AC
  Administered 2017-06-28: 2 g via INTRAVENOUS
  Filled 2017-06-28: qty 20

## 2017-06-28 MED ORDER — DEXTROSE 50 % IV SOLN
INTRAVENOUS | Status: AC
  Administered 2017-06-28: 50 mL
  Filled 2017-06-28: qty 50

## 2017-06-28 MED ORDER — VITAMIN K1 10 MG/ML IJ SOLN
5.0000 mg | Freq: Once | INTRAVENOUS | Status: AC
  Administered 2017-06-28: 5 mg via INTRAVENOUS
  Filled 2017-06-28: qty 0.5

## 2017-06-28 MED ORDER — SODIUM CHLORIDE 0.9 % IV SOLN
Freq: Once | INTRAVENOUS | Status: AC
  Administered 2017-06-28: 05:00:00 via INTRAVENOUS

## 2017-06-28 MED ORDER — DEXTROSE 50 % IV SOLN
25.0000 mL | Freq: Once | INTRAVENOUS | Status: AC
  Administered 2017-06-28: 25 mL via INTRAVENOUS
  Filled 2017-06-28: qty 50

## 2017-06-28 MED ORDER — ALBUMIN HUMAN 25 % IV SOLN: 12.5000 g | Freq: Once | INTRAVENOUS | Status: AC

## 2017-06-28 MED ORDER — ALBUMIN HUMAN 25 % IV SOLN
12.5000 g | Freq: Once | INTRAVENOUS | Status: AC
  Administered 2017-06-28: 12.5 g via INTRAVENOUS

## 2017-06-28 MED ORDER — DEXTROSE 50 % IV SOLN
25.0000 mL | INTRAVENOUS | Status: AC
  Administered 2017-06-28: 25 mL via INTRAVENOUS

## 2017-06-28 NOTE — Progress Notes (Addendum)
eLink Physician-Brief Progress Note Patient Name: Whitney Mathis DOB: November 16, 1965 MRN: [id number]   Date of Service  07/07/2017  HPI/Events of Note  Hypotensive on 4 pressors including epinephrine. CRRT with zero ultrafiltration. Family would like CPR in the event of cardiopulmonary arrest. Ionized calcium is 0.70 and phosphorus is 8.9  eICU Interventions  Albumin 12.5 gm iv x1 Continue current Rx Check ABG to exclude acidosis contributing to hypotension Despite hyperphosphatemia will give one dose of 2 gm of Calcium gluconate incase profound hypocalcemia is contributing to hypotension.        Kalesha Irving U Kellye Mizner 06/29/2017, 1:24 AM

## 2017-06-28 NOTE — Progress Notes (Deleted)
eLink Physician-Brief Progress Note Patient Name: Whitney Mathis DOB: [birth year] MRN: [id number]   Date of Service  06/24/2017  HPI/Events of Note  Intermittent agitation on the ventilator alternating with periods of sedation. Pt not yet appropriate for extubation. Hypokalemia.  eICU Interventions  Precedex gtts until pt alert, calm and ready for extubation. MgSo4 2 gm iv x 1, AM BMET     Intervention Category Minor Interventions: Electrolytes abnormality - evaluation and management;Agitation / anxiety - evaluation and management  [name] Whitney Mathis 06/27/2017, 8:07 PM

## 2017-06-28 NOTE — Progress Notes (Signed)
CRITICAL VALUE ALERT  Critical Value:  Calcium 6.0, INR 5.48  Date & Time Notied:  07/13/2017 0415  Provider Notified: Warrick Parisiangan MD  Orders Received/Actions taken: [facility]

## 2017-06-28 NOTE — Progress Notes (Signed)
Pt having multiple instances of hypoglycemia throughout the night. Hypoglycemia standing orders carried out. See MAR. MD aware. Will continue to monitor closely.

## 2017-06-28 NOTE — Progress Notes (Signed)
PCCM Interval Note  I met with the patient's family including her husband, son, siblings.  I have reviewed her current status, her failure to improve even on maximal critical care support.  I have explained to them that she is declining and will ultimately die from this disease.  They voiced understanding and are of course very upset.  They have decided that she will not undergo CPR, is a limited code.  I fully agree with this plan.  I have recommended that it would be appropriate to consider withdrawal of care to avoid discomfort.  Again they understand.  They are not prepared right now to withdraw care, let her go.  They would like for [place] to try and maintain current interventions.  I have asked them to let us know if they decide otherwise.  I will continue to have conversations with them.  Independent cc time 40 minutes  Baltazar Apo, MD, PhD 07/11/2017, 1:11 PM [facility] [phone] or if no answer [phone]

## 2017-06-28 NOTE — Progress Notes (Signed)
Pt still hypotensive despite being on max doses of 4 pressors. Notified Elink MD. New orders received and carried out. Will continue to closely monitor.

## 2017-06-28 NOTE — Progress Notes (Signed)
PULMONARY / CRITICAL CARE MEDICINE   Name: Whitney Mathis MRN: [id number] DOB: 1965/09/05    ADMISSION DATE:  07/08/2017 CONSULTATION DATE:  07/12/2017  REFERRING MD:  Hyacinth Meeker   CHIEF COMPLAINT:  Left Leg Ischemia   HISTORY OF PRESENT ILLNESS:   52 year old female with PMH of ETOH, Liver Cirrhosis, Tobacco use  Presents to [facility] 3/10 with left lung numbness that began earlier this morning. Presents to ED with dyspnea, tachycardia, and melena. WBC 25.1. Found to have no pulses below the knee of left side. Vascular consulted. While in the ED patient went into respiratory distress requiring intubation Transferred to [facility] for further work-up.   Upon arrival to [facility] patient HR 150-160, Systolic 90-100. Vascular at bedside with plans to take patient emergently to OR. While in unit patient became hypotensive requiring central line placement and initiation of neo and vasopressin gtt.    SUBJECTIVE / Interval Events:  Postoperative day #3 status post left embolectomy.  She has continued to decline she is in multiorgan dysfunction and despite CVVH and on epinephrine drip she continues to decline.  After long discussion with family she was changed to a limited CODE BLUE no shock no CPR.  I suspect we will transition to comfort care before the day is over as she is not responding to current interventions.  VITAL SIGNS: BP (!) 80/18   Pulse (!) 144   Temp (!) 96.9 F (36.1 C) (Rectal)   Resp (!) 0   Ht 5\' 4"  (1.626 m)   Wt 101.9 kg (224 lb 10.4 oz)   SpO2 100%   BMI 38.56 kg/m   HEMODYNAMICS: CVP:  [29 mmHg] 29 mmHg  VENTILATOR SETTINGS: Vent Mode: PRVC FiO2 (%):  [50 %-100 %] 100 % Set Rate:  [20 bmp-32 bmp] 32 bmp Vt Set:  [450 mL-470 mL] 470 mL PEEP:  [8 cmH20-9 cmH20] 9 cmH20 Plateau Pressure:  [28 cmH20-38 cmH20] 38 cmH20  INTAKE / OUTPUT: I/O last 3 completed shifts: In: 11753.9 [I.V.:9907.6; Blood:1156.3; IV Piggyback:690] Out: 1908 [Urine:25; Drains:350;  Other:1533]  PHYSICAL EXAMINATION: General: Critically ill female, nonresponsive. HEENT: Endotracheal tube to ventilator, G-tube to suction PSY: None available Neuro: Negative doll's eyes, pupils appear to be fixed but she is on epinephrine drip. CV: Heart sounds are irregular, on multiple pressor support with blood pressures of systolic of 70 PULM: even/non-labored, lungs bilaterally coarse rhonchi decreased in the bases GI: Negative bowel sounds Extremities: warm/dry, 3+ edema, multiple areas petechiae and ecchymosis left leg is cool Skin: no rashes or lesions   LABS:  BMET Recent Labs  Lab 06/23/2017 1018 06/16/2017 1703 06/17/2017 2154 07/10/2017 0258  NA 137 137  138 139 136  K 6.5* 5.6*  6.2* 5.1 5.2*  CL 110 103  108 101 94*  CO2 12* 16*  14*  --  21*  BUN 41* 34*  40* 37* 27*  CREATININE 4.02* 3.71*  4.16* 3.20* 3.16*  GLUCOSE 80 67  67 81 77    Electrolytes Recent Labs  Lab 06/26/17 0614 06/21/2017 0405 06/17/2017 1018 06/18/2017 1703 06/26/2017 0258  CALCIUM 6.6* 6.0* 6.1* 5.8*  5.9* 6.0*  MG 2.0 2.0  --   --  1.9  PHOS 6.8* 8.9*  --  8.9* 8.0*    CBC Recent Labs  Lab 06/19/2017 1703 07/05/2017 2154 06/19/2017 2220 06/22/2017 0258  WBC 45.9*  --  33.4* 25.5*  HGB 7.8* 8.5* 7.8* 7.5*  HCT 27.5* 25.0* 26.1* 25.3*  PLT 131*  --  PLATELET CLUMPS NOTED ON SMEAR, UNABLE TO ESTIMATE 88*    Coag's Recent Labs  Lab 06/26/17 0614 06/17/2017 0405 01-22-18 0258  INR 2.36 2.88 5.48*    Sepsis Markers Recent Labs  Lab 07/09/2017 2103 06/26/17 0515 06/26/17 0754 06/26/17 1118 07/06/2017 0405  LATICACIDVEN 3.1*  --  2.6* 2.4*  --   PROCALCITON 1.33 1.48  --   --  3.43    ABG Recent Labs  Lab 06/29/2017 2325 01-22-18 0146 01-22-18 0813  PHART 7.237* 7.262* 7.330*  PCO2ART 47.9 51.2* 47.5  PO2ART 71.5* 82.0* 65.0*    Liver Enzymes Recent Labs  Lab 06/20/2017 1203 06/26/17 0614 07/03/2017 1703 01-22-18 0258  AST 76* 90*  --  4,152*  ALT 31 35  --  487*   ALKPHOS 168* 164*  --  162*  BILITOT 11.1* 11.4*  --  11.7*  ALBUMIN 1.9* 1.9* 1.9* 2.1*    Cardiac Enzymes Recent Labs  Lab 06/19/2017 2103 06/26/17 0515 06/26/17 0614  TROPONINI 0.98* 0.30* 0.31*    Glucose Recent Labs  Lab 07/02/2017 1949 07/10/2017 2051 07/02/2017 2330 01-22-18 0337 01-22-18 0511 01-22-18 0803  GLUCAP 60* 88 79 68 86 69    Imaging Dg Chest Port 1 View  Result Date: 06/17/2017 CLINICAL DATA:  Central line placement. EXAM: PORTABLE CHEST 1 VIEW COMPARISON:  07/08/2017 FINDINGS: ET tube tip is above the carina. Right IJ catheter tip is in the projection of the SVC. There is a left IJ dialysis catheter with tip projecting over the cavoatrial junction. No pneumothorax identified. Stable cardiac enlargement. Diffuse bilateral interstitial and airspace opacities are identified compatible with ARDS. IMPRESSION: No pneumothorax following dialysis catheter placement. No significant change in aeration to the lungs compared with prior exam Electronically Signed   By: Signa Kellaylor  Stroud M.D.   On: 07/03/2017 16:08     STUDIES:  CTA 3/10 > 1. Cirrhotic liver. Associated splenomegaly and upper abdominal varices. Associated small amount of ascites in the abdomen and pelvis. 2. Cholelithiasis without evidence of acute cholecystitis. 3. Masslike consolidation within the right lower lung, lateral aspects of the right lower lobe abutting the pleura, measuring 4.7 cm greatest dimension, most likely atelectasis. Recommend follow-up chest CT at some point to ensure resolution. 4. No acute findings within the lumbar spine. Mild scoliosis. Mild degenerative change within the lumbar spine, as detailed above. No large disc bulge or protrusion seen. 5. Anasarca. 6. Aortic atherosclerosis. CXR 3/10 > Endotracheal tube seen ending 2-3 cm above the carina.Lungs remain hypoexpanded. Patchy bilateral airspace opacities are improved from the recent prior study and may reflect mild pulmonary  edema or pneumonia. Borderline cardiomegaly.  CULTURES: Blood 3/10 >> 2 of 2 coag negative staph >>  Urine 3/10 >> neg Resp 3/10  >> rare GNR >>   ANTIBIOTICS: Rocephin 3/10  Vancomycin 3/11 >> 07/11/2017 Zosyn 3/11 >>    SIGNIFICANT EVENTS: 3/10 > Presents to OSH  3/10 > Status post left lower extremity femoral embolectomy and 4 compartment fasciotomy 05-11-17 left extremity femoral embolectomy demonstrated bacteria in the clot  LINES/TUBES: ETT 3/10 >> RIJ 3/10 >>  07/14/2017 left IJ HD catheter>>  DISCUSSION: 52 year old female presents to ED with reported left leg numbness for the last few days.  Noted to have possible UTI, gram-positive bacteremia, evolving shock and respiratory failure.  Vascular was consulted as patient was found to have no pulses below the knee.   ASSESSMENT / PLAN:  PULMONARY A: Acute Hypoxic Respiratory Failure  Masslike consolidation within the  right lower lung, lateral aspects of the right lower lobe abutting the pleura, measuring 4.7 H/O Tobacco Use  Bilateral pulmonary infiltrates, evolving ARDS Hemoptysis P:   Continue PRBC, ARDS protocol as she can tolerate.  Consider a transition to pressure control if unable to tolerate low tidal volume ventilation Consider bronchoscopy to evaluate for diffuse alveolar hemorrhage, source of hemoptysis Bronchodilators as needed Follow chest x-ray Defer workup for right lower lobe mass at this time 07/10/2017 she is continued to decline over the last 24 hours, really reaching stage of futile care.  Ongoing discussion with family concerning 1 now is a limited CODE BLUE no shock no CPR.  Suspect within the next 24 hours we will transition to comfort care is at maximum interventions and she is continues to fail.  CARDIOVASCULAR A:  Hypotension in setting of hypovolemia, hemorraghic, or septic shock  New A.Fib  Ischemic Left Foot, status post left femoral embolectomy and fasciotomy on 3/10 Mobile mass noted  on mitral valve, suspicious for endocarditis, associated MR P:  Add norepinephrine and try to transition off of phenylephrine Epinephrine added 07/13/2017 Continue vasopressin for now Left femoral wound care Appreciate vascular surgery assistance, she is too sick to go to the OR for any further interventions. TEE when she is stable to undergo, suspect infectious endocarditis with coag negative staph Continue stress dose steroids Defer anticoagulation at this time due to her coagulopathy  RENAL A:   Anion Gap Metabolic Acidosis with Lactic Acidosis, clearing Hypomagnesemia, improved P:   Continue to follow BMP, urine output Currently on CVVH with normalization of pH Maintenance fluids 50 cc/h   GASTROINTESTINAL A:   Alcoholic cirrhosis with evidence for active GI blood loss +Hematemesis and Hematochezia   Esophageal Varices  P:   Continues to have evidence for GI blood loss.  Appreciate gastroenterology assistance.  EGD deferred until her coagulopathy is improved, suspect that she will need variceal banding Continue octreotide as ordered Continue Protonix as ordered Follow ammonia given her encephalopathy; start lactulose 3/12 Vitamin K as below  HEMATOLOGIC Recent Labs    06/26/2017 2220 06/17/2017 0258  HGB 7.8* 7.5*   Lab Results  Component Value Date   INR 5.48 (HH) 06/27/2017   INR 2.88 07/05/2017   INR 2.36 06/26/2017    A:   Acute on chronic Anemia, acute GI blood loss  +Hematemesis and Hematochezia Thrombocytopenia  Hepatic coagulopathy P:  Follow serial CBCs Transfusion goal hemoglobin 7-8 given the potential for active bleeding Gastroenterology evaluation as above Holding all anticoagulants at this time given her coagulopathy, need to consider a potential start date given her lower extremity embolic phenomenon Vitamin K again on 3/12 To follow INR 07/11/2017 suspect she will be withdrawal of care with the 24 hours  INFECTIOUS A:   Bacteremia > 2 of 2  coag negative staph, sensitivities pending U/A with many bacteria  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 P:   Continue Zosyn, vancomycin stopped on 3/11 given culture data Continue to follow cultures  TEE when the patient stable undergo, doubt she will survive to undergo TEE.  ENDOCRINE CBG (last 3)  Recent Labs    07/02/2017 0337 06/18/2017 0511 07/16/2017 0803  GLUCAP 68 86 69    A:   Hyperglycemia Hpoglycemia P:   Following CBG Restart stress dose steroids on 3/12 Glucose as needed per protocol   NEUROLOGIC A:   ETOH withdrawal  H/O ETOH Abuse  Hepatic  encephalopathy  P:   RASS goal: 0/-1 Stop Precedex, continue fentanyl drip Add Versed as needed Lactulose Continue folic acid, thiamine   FAMILY  - Updates: Husband and mother updated at bedside.  Long discussions concerning CODE STATUS.  Dr. Arbie Cookey and Brett Canales Ikey Omary NP had long discussion with family with results being limited CODE BLUE no CPR no shock.  - Inter-disciplinary family meet or Palliative Care meeting due by:  07/02/2017  App critical care time 60 minutes  Brett Canales Yamir Carignan ACNP [facility] PCCM Pager [phone] till 1 pm If no answer page [phone] 06/29/2017, 8:47 AM

## 2017-06-28 NOTE — Progress Notes (Signed)
   April 07, 2018 1100  Clinical Encounter Type  Visited With Patient and family together  Visit Type Initial  Referral From Nurse  Consult/Referral To Chaplain  Spiritual Encounters  Spiritual Needs Prayer;Emotional  Stress Factors  Patient Stress Factors Exhausted  Family Stress Factors Exhausted    When chaplain visited, Pt was intubated. Family of husband, mother in-law and son on-site in emotional distress and tearful. Chaplain and family had discussions about Pt's prognosis. Family requested prayer which I helped lead. I provided emotional support through reflective listening, compassionate presence and prayer. Chaplain will revisit to check on family again later in the day.  Lissy Deuser a Water quality scientistMusiko-Holley, E. I. du PontChaplain

## 2017-06-28 NOTE — Progress Notes (Signed)
[facility] Progress Note  Whitney Mathis 52 y.o. 12/26/1965  CC:  GI bleed, history of cirrhosis   Subjective: Yesterday's events noted. Continues to be hypotensive on maximum pressor support. Ongoing multi organ system failure. Family at bedside. Discussed with nursing staff. No melena or hematochezia. Rectal tube has brown-colored fluid.  ROS : Not able to obtain   Objective: Vital signs in last 24 hours: Vitals:   July 07, 2017 1100 07/07/2017 1123  BP: (!) 83/47   Pulse:    Resp: (!) 0   Temp:  97.7 F (36.5 C)  SpO2:      Physical Exam:  Gen. Critically ill-appearing patient. Intubated, sedated Abdomen. Distended., Not able to appreciate tenderness, or sounds present. Lungs. Coarse breath sounds bilaterally. Cardiology.Tachycardia.  Lab Results: Recent Labs    07/10/2017 0405  06/28/2017 1703 07/10/2017 2154 Jul 07, 2017 0258  NA 134*   < > 137  138 139 136  K 5.9*   < > 5.6*  6.2* 5.1 5.2*  CL 107   < > 103  108 101 94*  CO2 16*   < > 16*  14*  --  21*  GLUCOSE 107*   < > 67  67 81 77  BUN 39*   < > 34*  40* 37* 27*  CREATININE 3.52*   < > 3.71*  4.16* 3.20* 3.16*  CALCIUM 6.0*   < > 5.8*  5.9*  --  6.0*  MG 2.0  --   --   --  1.9  PHOS 8.9*  --  8.9*  --  8.0*   < > = values in this interval not displayed.   Recent Labs    06/26/17 0614 06/22/2017 1703 07/07/17 0258  AST 90*  --  4,152*  ALT 35  --  487*  ALKPHOS 164*  --  162*  BILITOT 11.4*  --  11.7*  PROT 5.7*  --  5.5*  ALBUMIN 1.9* 1.9* 2.1*   Recent Labs    06/22/2017 1203  06/17/2017 2103  07/14/2017 2220 07-Jul-2017 0258  WBC 25.1*  --  22.0*   < > 33.4* 25.5*  NEUTROABS 21.1*  --  18.7*  --   --   --   HGB 9.9*   < > 8.5*   < > 7.8* 7.5*  HCT 30.1*   < > 25.9*   < > 26.1* 25.3*  MCV 95.9  --  97.4   < > 106.1* 105.0*  PLT 67*  --  53*   < > PLATELET CLUMPS NOTED ON SMEAR, UNABLE TO ESTIMATE 88*   < > = values in this interval not displayed.   Recent Labs    06/29/2017 0405  07/07/2017 0258  LABPROT 29.9* 49.5*  INR 2.88 5.48*      Assessment/Plan: - Decompensated alcoholic cirrhosis. MELD score 40 as of 07/01/2017  - ?? GI bleed. CT scan showing possible upper abdominal varices.  I have discussed with nursing staff multiple times. She has reported no melena or hematochezia. - Multi organ system failure - Abnormal LFTs with AST >4000,  Coagulopathy with INR 5.48  Probably ischemic hepatitis - Respiratory failure. Currently intubated - Possible bacterial endocarditis - Acute kidney injury - Electrolyte disturbance  - Septic shock. Currently on pressor support - Left lower extent ischemia. S/P embolectomy and 4 compartment fasciotomy early this morning.  Recommendations -------------------------- - Patient continues to have worsening multiorgan system failure. Worsening coagulopathy with INR of 5.48 probably from underlying ischemic hepatitis along with decompensated  cirrhosis. - Patient with possible bacterial endocarditis causing embolic events, left lower leg ischemia, septic shock currently on maximal pressor support. -  rectal tube shows brown color liquid. I have repeatedly discussed with nursing staff in last 2 days. No evidence of melena or hematochezia.  - Hold off on EGD at this point. Discussed with family members at bedside.. Very poor prognosis again discussed. - Not a candidate for liver transplant because of ongoing alcohol use.  Kathi DerParag Gaje Tennyson MD, FACP 2017-07-05, 11:30 AM  Contact #  [phone]

## 2017-06-28 NOTE — Progress Notes (Signed)
Subjective:  Doing poorly overnight.. Still hypotensive on max pressors, hypoglycemic- CRRT started and numbers improved  Objective Vital signs in last 24 hours: Vitals:   07/08/2017 0533 07/02/2017 0549 06/26/2017 0600 07/06/2017 0700  BP:   (!) 86/31 (!) 81/15  Pulse: (!) 119 (!) 119 (!) 124 (!) 144  Resp: (!) 32 (!) 32 (!) 32 (!) 0  Temp: (!) 96.9 F (36.1 C) (!) 96.9 F (36.1 C)    TempSrc: Rectal Rectal    SpO2: 97% 99% 98% 100%  Weight:      Height:       Weight change: 15.8 kg (34 lb 13.3 oz)  Intake/Output Summary (Last 24 hours) at 06/18/2017 0745 Last data filed at 06/17/2017 0700 Gross per 24 hour  Intake 8845.59 ml  Output 1733 ml  Net 7112.59 ml    Assessment/Plan: 52 year old female with history of cirrhosis- not candidate right now for liver transplant due to still drinking- now with constellation of recent issues, arterial clot to leg, bacteremia , endocarditis and multisystem organ failure.  Baseline crt normal 1.Renal- essentially anuric - hypotensive on pressors- seems like ATN causing AKI- CRRT initiated 3/12 when thought was for full support.  She has done poorly overnight- she will not survive this it seems, family at bedside- I suspect will move to comfort care later today 2. Hypertension/volume  - overloaded- too unstable for UF  3. Anemia  - situational after surgery- required transfusion hgb currently above 9 4. Elytes- hyperkalemia- improved  5. Acidosis- also improved but unfortunately is not making a difference for her hemodynamically    Will leave to CCM discretion to stop CRRT when appropriate    [place]one Gear A    Labs: Basic Metabolic Panel: Recent Labs  Lab 06/17/2017 0405 06/18/2017 1018 06/22/2017 1703 07/10/2017 2154 06/17/2017 0258  NA 134* 137 137  138 139 136  K 5.9* 6.5* 5.6*  6.2* 5.1 5.2*  CL 107 110 103  108 101 94*  CO2 16* 12* 16*  14*  --  21*  GLUCOSE 107* 80 67  67 81 77  BUN 39* 41* 34*  40* 37* 27*  CREATININE 3.52*  4.02* 3.71*  4.16* 3.20* 3.16*  CALCIUM 6.0* 6.1* 5.8*  5.9*  --  6.0*  PHOS 8.9*  --  8.9*  --  8.0*   Liver Function Tests: Recent Labs  Lab 07/03/2017 1203 06/26/17 0614 07/11/2017 1703 07/12/2017 0258  AST 76* 90*  --  4,152*  ALT 31 35  --  487*  ALKPHOS 168* 164*  --  162*  BILITOT 11.1* 11.4*  --  11.7*  PROT 6.0* 5.7*  --  5.5*  ALBUMIN 1.9* 1.9* 1.9* 2.1*   Recent Labs  Lab 07/07/2017 1203  LIPASE 39   Recent Labs  Lab 07/16/2017 1203 06/21/2017 1042 07/03/2017 0258  AMMONIA 33 92* 85*   CBC: Recent Labs  Lab 07/15/2017 1203  07/15/2017 2103  07/04/2017 0405 07/08/2017 1018 06/26/2017 1703 07/06/2017 2154 07/10/2017 2220 07/16/2017 0258  WBC 25.1*  --  22.0*   < > 33.9* 33.9* 45.9*  --  33.4* 25.5*  NEUTROABS 21.1*  --  18.7*  --   --   --   --   --   --   --   HGB 9.9*   < > 8.5*   < > 9.4* 9.1* 7.8* 8.5* 7.8* 7.5*  HCT 30.1*   < > 25.9*   < > 29.3* 30.3* 27.5* 25.0* 26.1* 25.3*  MCV 95.9  --  97.4   < > 102.8* 107.1* 110.4*  --  106.1* 105.0*  PLT 67*  --  53*   < > 164 150 131*  --  PLATELET CLUMPS NOTED ON SMEAR, UNABLE TO ESTIMATE 88*   < > = values in this interval not displayed.   Cardiac Enzymes: Recent Labs  Lab 07/03/2017 1203 06/26/2017 1731  07/11/2017 2103 06/26/17 0515 06/26/17 0614 06/26/17 1120 06/26/17 1635 06/26/17 2246 07/01/2017 0405 07/02/2017 1018  CKTOTAL  --   --    < > 1,062*  --   --  910* 1,032* 1,118* 1,474* 1,783*  TROPONINI 0.29* 0.35*  --  0.98* 0.30* 0.31*  --   --   --   --   --    < > = values in this interval not displayed.   CBG: Recent Labs  Lab 06/29/2017 1949 06/26/2017 2051 06/29/2017 2330 07/03/2017 0337 06/20/2017 0511  GLUCAP 60* 88 79 68 86    Iron Studies: No results for input(s): IRON, TIBC, TRANSFERRIN, FERRITIN in the last 72 hours. Studies/Results: Dg Chest Port 1 View  Result Date: 07/09/2017 CLINICAL DATA:  Central line placement. EXAM: PORTABLE CHEST 1 VIEW COMPARISON:  06/21/2017 FINDINGS: ET tube tip is above the carina.  Right IJ catheter tip is in the projection of the SVC. There is a left IJ dialysis catheter with tip projecting over the cavoatrial junction. No pneumothorax identified. Stable cardiac enlargement. Diffuse bilateral interstitial and airspace opacities are identified compatible with ARDS. IMPRESSION: No pneumothorax following dialysis catheter placement. No significant change in aeration to the lungs compared with prior exam Electronically Signed   By: Kerby Moors M.D.   On: 07/02/2017 16:08   Dg Chest Port 1 View  Result Date: 06/24/2017 CLINICAL DATA:  52 year old female with acute respiratory failure and shortness breath. Subsequent encounter. EXAM: PORTABLE CHEST 1 VIEW COMPARISON:  06/26/2017 chest x-ray. FINDINGS: Endotracheal tube tip 3.6 cm above the carina. Right central line tip projects at the level of the proximal superior vena cava. Progressive prominent diffuse airspace disease may represent pulmonary edema. Infection not excluded in proper clinical setting. Cardiomegaly. Poor delineation of mediastinal structures although grossly unchanged. No obvious pneumothorax. IMPRESSION: Progressive prominent airspace disease approaching ARDS pattern. This may represent pulmonary edema although infection not excluded in proper clinical setting. Cardiomegaly. Electronically Signed   By: Genia Del M.D.   On: 06/26/2017 08:43   Medications: Infusions: . sodium chloride    . sodium chloride 50 mL/hr at 07/13/2017 0800  . dextrose 30 mL/hr at 07/16/2017 1423  . epinephrine 20 mcg/min (06/23/2017 0658)  . fentaNYL infusion INTRAVENOUS 50 mcg/hr (07/02/2017 0947)  . norepinephrine (LEVOPHED) Adult infusion 50 mcg/min (07/10/2017 0603)  . octreotide  (SANDOSTATIN)    IV infusion 50 mcg/hr (07/03/2017 2205)  . pantoprozole (PROTONIX) infusion 8 mg/hr (07/11/2017 0352)  . phenylephrine (NEO-SYNEPHRINE) Adult infusion 400 mcg/min (07/06/2017 0405)  . piperacillin-tazobactam (ZOSYN)  IV Stopped (06/20/2017 0631)  .  dialysate (PRISMASATE) 1,500 mL/hr at 07/07/2017 0641  . sodium bicarbonate (isotonic) 1000 mL infusion 300 mL/hr at 07/02/2017 0727  . sodium bicarbonate (isotonic) 1000 mL infusion 500 mL/hr at 07/04/2017 0654  . sodium chloride    . vasopressin (PITRESSIN) infusion - *FOR SHOCK* 0.03 Units/min (07/15/2017 2011)    Scheduled Medications: . chlorhexidine gluconate (MEDLINE KIT)  15 mL Mouth Rinse BID  . Chlorhexidine Gluconate Cloth  6 each Topical Daily  . folic acid  1 mg Intravenous Daily  .  hydrocortisone sod succinate (SOLU-CORTEF) inj  50 mg Intravenous Q6H  . ketamine (KETALAR) injection 79m/mL (IV use)  1 mg/kg Intravenous Once  . lactulose  300 mL Rectal Daily  . mouth rinse  15 mL Mouth Rinse QID  . naphazoline-glycerin  1-2 drop Both Eyes Q6H  . sodium chloride flush  10-40 mL Intracatheter Q12H  . thiamine  100 mg Oral Daily   Or  . thiamine  100 mg Intravenous Daily    have reviewed scheduled and prn medications.  Physical Exam: General: unresponsive Heart: tachy Lungs: CBS bilat Abdomen: distended Extremities: pitting edema Dialysis Access: vascath placed 3/12    06/24/2017,7:45 AM  LOS: 3 days

## 2017-06-28 NOTE — Progress Notes (Signed)
Patient ID: Whitney Mathis, female   DOB: November 01, 1965, 52 y.o.   MRN: [id number] Progressive clinical deterioration.  Now on maximal vasopressors to maintain blood pressure of 70.  CVVHD underway.  Discussed with the husband present and critical care team present.  Does not appear that she will survive and we will not plan to take to the operating room today for obvious reasons.  VAC intact.

## 2017-06-29 ENCOUNTER — Inpatient Hospital Stay (HOSPITAL_COMMUNITY): Payer: BLUE CROSS/BLUE SHIELD

## 2017-06-29 DIAGNOSIS — J9600 Acute respiratory failure, unspecified whether with hypoxia or hypercapnia: Secondary | ICD-10-CM

## 2017-06-29 LAB — GLUCOSE, CAPILLARY
GLUCOSE-CAPILLARY: 99 mg/dL (ref 65–99)
Glucose-Capillary: 67 mg/dL (ref 65–99)
Glucose-Capillary: 72 mg/dL (ref 65–99)
Glucose-Capillary: 73 mg/dL (ref 65–99)
Glucose-Capillary: 74 mg/dL (ref 65–99)
Glucose-Capillary: 78 mg/dL (ref 65–99)
Glucose-Capillary: 79 mg/dL (ref 65–99)

## 2017-06-29 LAB — BPAM FFP
BLOOD PRODUCT EXPIRATION DATE: 201903152359
Blood Product Expiration Date: 201903152359
ISSUE DATE / TIME: 201903130440
ISSUE DATE / TIME: 201903130440
Unit Type and Rh: 5100
Unit Type and Rh: 5100

## 2017-06-29 LAB — TYPE AND SCREEN
ABO/RH(D): O POS
ANTIBODY SCREEN: POSITIVE
Donor AG Type: NEGATIVE
Unit division: 0

## 2017-06-29 LAB — RENAL FUNCTION PANEL
ALBUMIN: 1.3 g/dL — AB (ref 3.5–5.0)
ANION GAP: 22 — AB (ref 5–15)
ANION GAP: 19 — AB (ref 5–15)
Albumin: 1.7 g/dL — ABNORMAL LOW (ref 3.5–5.0)
Albumin: 1.4 g/dL — ABNORMAL LOW (ref 3.5–5.0)
Anion gap: 22 — ABNORMAL HIGH (ref 5–15)
BUN: 12 mg/dL (ref 6–20)
BUN: 9 mg/dL (ref 6–20)
BUN: 6 mg/dL (ref 6–20)
CALCIUM: 4.4 mg/dL — AB (ref 8.9–10.3)
CHLORIDE: 80 mmol/L — AB (ref 101–111)
CHLORIDE: 89 mmol/L — AB (ref 101–111)
CO2: 30 mmol/L (ref 22–32)
CO2: 25 mmol/L (ref 22–32)
CO2: 22 mmol/L (ref 22–32)
Calcium: 4.6 mg/dL — CL (ref 8.9–10.3)
Calcium: 4.9 mg/dL — CL (ref 8.9–10.3)
Chloride: 83 mmol/L — ABNORMAL LOW (ref 101–111)
Creatinine, Ser: 1.86 mg/dL — ABNORMAL HIGH (ref 0.44–1.00)
Creatinine, Ser: 1.58 mg/dL — ABNORMAL HIGH (ref 0.44–1.00)
Creatinine, Ser: 1.46 mg/dL — ABNORMAL HIGH (ref 0.44–1.00)
GFR calc Af Amer: 35 mL/min — ABNORMAL LOW (ref >60)
GFR, EST AFRICAN AMERICAN: 43 mL/min — AB (ref >60)
GFR, EST AFRICAN AMERICAN: 47 mL/min — AB (ref >60)
GFR, EST NON AFRICAN AMERICAN: 30 mL/min — AB (ref >60)
GFR, EST NON AFRICAN AMERICAN: 37 mL/min — AB (ref >60)
GFR, EST NON AFRICAN AMERICAN: 41 mL/min — AB (ref >60)
Glucose, Bld: 76 mg/dL (ref 65–99)
Glucose, Bld: 63 mg/dL — ABNORMAL LOW (ref 65–99)
Glucose, Bld: 76 mg/dL (ref 65–99)
PHOSPHORUS: 6.5 mg/dL — AB (ref 2.5–4.6)
Phosphorus: 6.3 mg/dL — ABNORMAL HIGH (ref 2.5–4.6)
Phosphorus: 6.4 mg/dL — ABNORMAL HIGH (ref 2.5–4.6)
Potassium: 5.2 mmol/L — ABNORMAL HIGH (ref 3.5–5.1)
Potassium: 5.4 mmol/L — ABNORMAL HIGH (ref 3.5–5.1)
Potassium: 5.9 mmol/L — ABNORMAL HIGH (ref 3.5–5.1)
SODIUM: 130 mmol/L — AB (ref 135–145)
Sodium: 132 mmol/L — ABNORMAL LOW (ref 135–145)
Sodium: 130 mmol/L — ABNORMAL LOW (ref 135–145)

## 2017-06-29 LAB — BLOOD GAS, ARTERIAL
Acid-Base Excess: 6.3 mmol/L — ABNORMAL HIGH (ref 0.0–2.0)
Bicarbonate: 31.7 mmol/L — ABNORMAL HIGH (ref 20.0–28.0)
Drawn by: 414221
FIO2: 100
LHR: 32 {breaths}/min
O2 SAT: 89.4 %
PATIENT TEMPERATURE: 91
PCO2 ART: 47.5 mmHg (ref 32.0–48.0)
PEEP: 9 cmH2O
PH ART: 7.416 (ref 7.350–7.450)
VT: 470 mL
pO2, Arterial: 50 mmHg — ABNORMAL LOW (ref 83.0–108.0)

## 2017-06-29 LAB — BPAM RBC
BLOOD PRODUCT EXPIRATION DATE: 201904062359
ISSUE DATE / TIME: 201903121237
UNIT TYPE AND RH: 5100

## 2017-06-29 LAB — HEMOGLOBIN AND HEMATOCRIT, BLOOD
HCT: 21.9 % — ABNORMAL LOW (ref 36.0–46.0)
HEMOGLOBIN: 6.9 g/dL — AB (ref 12.0–15.0)

## 2017-06-29 LAB — PREPARE FRESH FROZEN PLASMA
Unit division: 0
Unit division: 0

## 2017-06-29 LAB — LACTIC ACID, PLASMA: LACTIC ACID, VENOUS: 14.7 mmol/L — AB (ref 0.5–1.9)

## 2017-06-29 LAB — MAGNESIUM: Magnesium: 1.7 mg/dL (ref 1.7–2.4)

## 2017-06-29 MED ORDER — DEXTROSE 50 % IV SOLN
25.0000 mL | Freq: Once | INTRAVENOUS | Status: AC
  Administered 2017-06-29: 25 mL via INTRAVENOUS

## 2017-06-29 MED ORDER — SODIUM CHLORIDE 0.9 % IV SOLN
2.0000 g | INTRAVENOUS | Status: DC
  Administered 2017-06-29: 2 g via INTRAVENOUS
  Filled 2017-06-29 (×2): qty 20

## 2017-06-29 MED ORDER — PRISMASOL BGK 4/2.5 32-4-2.5 MEQ/L IV SOLN
INTRAVENOUS | Status: DC
  Administered 2017-06-29 – 2017-06-30 (×3): via INTRAVENOUS_CENTRAL
  Filled 2017-06-29 (×4): qty 5000

## 2017-06-29 MED ORDER — PRISMASOL BGK 4/2.5 32-4-2.5 MEQ/L IV SOLN
INTRAVENOUS | Status: DC
  Administered 2017-06-29 – 2017-06-30 (×2): via INTRAVENOUS_CENTRAL
  Filled 2017-06-29 (×6): qty 5000

## 2017-06-29 MED ORDER — CALCIUM GLUCONATE 10 % IV SOLN: 2.0000 g | Freq: Once | INTRAVENOUS | Status: DC

## 2017-06-29 MED ORDER — PIPERACILLIN-TAZOBACTAM 3.375 G IVPB 30 MIN
3.3750 g | Freq: Four times a day (QID) | INTRAVENOUS | Status: DC
  Filled 2017-06-29: qty 50

## 2017-06-29 MED ORDER — PRISMASOL BGK 0/2.5 32-2.5 MEQ/L IV SOLN
INTRAVENOUS | Status: DC
  Administered 2017-06-29 – 2017-06-30 (×3): via INTRAVENOUS_CENTRAL
  Filled 2017-06-29 (×8): qty 5000

## 2017-06-29 MED ORDER — CALCIUM GLUCONATE 10 % IV SOLN
1.0000 g | Freq: Once | INTRAVENOUS | Status: AC
  Administered 2017-06-29: 1 g via INTRAVENOUS
  Filled 2017-06-29: qty 10

## 2017-06-29 MED ORDER — SODIUM CHLORIDE 0.9 % IV SOLN
2.0000 g | Freq: Once | INTRAVENOUS | Status: AC
  Administered 2017-06-29: 2 g via INTRAVENOUS
  Filled 2017-06-29: qty 20

## 2017-06-29 NOTE — Progress Notes (Signed)
PULMONARY / CRITICAL CARE MEDICINE   Name: Whitney Mathis MRN: [id number] DOB: 1966-04-14    ADMISSION DATE:  06/16/2017 CONSULTATION DATE:  06/16/2017  REFERRING MD:  Sabra Heck   CHIEF COMPLAINT:  Left Leg Ischemia   HISTORY OF PRESENT ILLNESS:   52 year old female with PMH of ETOH, Liver Cirrhosis, Tobacco use  Presents to [facility] 3/10 with left lung numbness that began earlier this morning. Presents to ED with dyspnea, tachycardia, and melena. WBC 25.1. Found to have no pulses below the knee of left side. Vascular consulted. While in the ED patient went into respiratory distress requiring intubation Transferred to [facility] for further work-up.   Upon arrival to [facility] patient HR [phone], Systolic [phone]. Vascular at bedside with plans to take patient emergently to OR. While in unit patient became hypotensive requiring central line placement and initiation of neo and vasopressin gtt.    SUBJECTIVE / Interval Events:  Confirmed DNR status with patient's family on 3/13, family meeting Continues to be dependent on high-dose pressors Note increasing lactic acid despite aggressive care, CVVHD Having bloody stools and bloody secretions from both gastric tube and endotracheal tube  VITAL SIGNS: BP (!) 74/34   Pulse 90   Temp (!) 89.6 F (32 C) (Rectal)   Resp (!) 32   Ht 5' 4"  (1.626 m)   Wt 101.9 kg (224 lb 10.4 oz)   SpO2 100%   BMI 38.56 kg/m   HEMODYNAMICS:    VENTILATOR SETTINGS: Vent Mode: PRVC FiO2 (%):  [100 %] 100 % Set Rate:  [32 bmp] 32 bmp Vt Set:  [470 mL] 470 mL PEEP:  [9 cmH20] 9 cmH20 Plateau Pressure:  [32 cmH20-49 cmH20] 49 cmH20  INTAKE / OUTPUT: I/O last 3 completed shifts: In: 14452.6 [I.V.:13097.6; Blood:530; Other:25; IV [id number]:875] Out: 6433 [Drains:500; Other:588]  PHYSICAL EXAMINATION: General: Critically ill, anasarca HEENT: Endotracheal tube in place, gastric tube in place Neuro: She may have opened her eyes to voice, otherwise  completely unresponsive CV: Irregularly irregular, very distant, unclear whether there is a murmur PULM: Ventilated, coarse bilaterally, no wheezing GI: No bowel sounds Extremities: Left lower extremity is cool, wound vacs in place Skin: Skin is mottled   LABS:  BMET Recent Labs  Lab 07/10/2017 0258 06/19/2017 1549 06/29/17 0515  NA 136 133* 132*  K 5.2* 5.5* 5.2*  CL 94* 84* 80*  CO2 21* 25 30  BUN 27* 18 12  CREATININE 3.16* 2.46* 1.86*  GLUCOSE 77 61* 76    Electrolytes Recent Labs  Lab 06/17/2017 0405  06/16/2017 0258 07/03/2017 1549 06/29/17 0515  CALCIUM 6.0*   < > 6.0* 5.2* 4.6*  MG 2.0  --  1.9  --  1.7  PHOS 8.9*   < > 8.0* 7.3* 6.3*   < > = values in this interval not displayed.    CBC Recent Labs  Lab 06/24/2017 2220 07/05/2017 0258 07/06/2017 1127 06/29/17 0100  WBC 33.4* 25.5* 18.3*  --   HGB 7.8* 7.5* 7.0* 6.9*  HCT 26.1* 25.3* 22.7* 21.9*  PLT PLATELET CLUMPS NOTED ON SMEAR, UNABLE TO ESTIMATE 88* 69*  --     Coag's Recent Labs  Lab 07/15/2017 0405 07/02/2017 0258 06/17/2017 1127  INR 2.88 5.48* 4.05*    Sepsis Markers Recent Labs  Lab 06/26/2017 2103 06/26/17 0515  06/26/17 1118 07/04/2017 0405 07/16/2017 0834 06/29/17 0020  LATICACIDVEN 3.1*  --    < > 2.4*  --  13.0* 14.7*  PROCALCITON 1.33 1.48  --   --  3.43  --   --    < > = values in this interval not displayed.    ABG Recent Labs  Lab 07/13/2017 0146 07/12/2017 0813 06/29/17 0340  PHART 7.262* 7.330* 7.416  PCO2ART 51.2* 47.5 47.5  PO2ART 82.0* 65.0* 50.0*    Liver Enzymes Recent Labs  Lab 07/06/2017 1203 06/26/17 0614  06/24/2017 0258 06/26/2017 1549 06/29/17 0515  AST 76* 90*  --  4,152*  --   --   ALT 31 35  --  487*  --   --   ALKPHOS 168* 164*  --  162*  --   --   BILITOT 11.1* 11.4*  --  11.7*  --   --   ALBUMIN 1.9* 1.9*   < > 2.1* 2.0* 1.7*   < > = values in this interval not displayed.    Cardiac Enzymes Recent Labs  Lab 06/17/2017 2103 06/26/17 0515 06/26/17 0614   TROPONINI 0.98* 0.30* 0.31*    Glucose Recent Labs  Lab 06/26/2017 1149 06/24/2017 1546 07/15/2017 1943 06/20/2017 2320 06/29/17 0333 06/29/17 0753  GLUCAP 88 73 67 82 73 72    Imaging Dg Chest Port 1 View  Result Date: 06/29/2017 CLINICAL DATA:  52 year old female with decompensated cirrhosis, multiorgan system failure. Coagulopathy. EXAM: PORTABLE CHEST 1 VIEW COMPARISON:  06/24/2017 and earlier. FINDINGS: Endotracheal tube tip is in satisfactory position between the level the clavicles and carina. Stable dual lumen left IJ approach vascular catheter. Stable single lumen right IJ approach catheter. The patient is more rotated to the right. Increasing consolidation throughout the right upper lobe since 2 days ago. Diffuse bilateral pulmonary opacity elsewhere resembling ARDS. Most of the mediastinal contours are obscured. The diaphragm has become obscured bilaterally since 07/12/2017. IMPRESSION: 1. Satisfactory lines and tubes. 2. Worsening bilateral ventilation since 07/13/2017 most resembling ARDS. Progressive right upper lobe consolidation since 06/22/2017. Electronically Signed   By: Genevie Ann M.D.   On: 06/29/2017 08:06     STUDIES:  CTA 3/10 > 1. Cirrhotic liver. Associated splenomegaly and upper abdominal varices. Associated small amount of ascites in the abdomen and pelvis. 2. Cholelithiasis without evidence of acute cholecystitis. 3. Masslike consolidation within the right lower lung, lateral aspects of the right lower lobe abutting the pleura, measuring 4.7 cm greatest dimension, most likely atelectasis. Recommend follow-up chest CT at some point to ensure resolution. 4. No acute findings within the lumbar spine. Mild scoliosis. Mild degenerative change within the lumbar spine, as detailed above. No large disc bulge or protrusion seen. 5. Anasarca. 6. Aortic atherosclerosis. CXR 3/10 > Endotracheal tube seen ending 2-3 cm above the carina.Lungs remain hypoexpanded. Patchy  bilateral airspace opacities are improved from the recent prior study and may reflect mild pulmonary edema or pneumonia. Borderline cardiomegaly.  CULTURES: Blood 3/10 >> 2 of 2 coag negative staph >>  Urine 3/10 >> neg Resp 3/10  >> rare GNR >>   ANTIBIOTICS: Rocephin 3/10  Vancomycin 3/11 >> 07/14/2017 Zosyn 3/11 >>    SIGNIFICANT EVENTS: 3/10 > Presents to OSH  3/10 > Status post left lower extremity femoral embolectomy and 4 compartment fasciotomy 06/29/2017 left extremity femoral embolectomy demonstrated bacteria in the clot  LINES/TUBES: ETT 3/10 >> RIJ 3/10 >>  06/22/2017 left IJ HD catheter>>   DISCUSSION: 52 year old woman with cirrhosis presents to ED with reported left leg numbness for the last few days.  Noted to have possible UTI, gram-positive bacteremia, evolving shock and respiratory failure.  Vascular was consulted as  patient was found to have no pulses below the knee.   ASSESSMENT / PLAN:  PULMONARY A: Acute Hypoxic Respiratory Failure  Masslike consolidation within the right lower lung, lateral aspects of the right lower lobe abutting the pleura, measuring 4.7cm H/O Tobacco Use  ARDS Hemoptysis P:   Continue ARDS protocol.  Could consider transition of pressure control if she is unable to tolerate low tidal volume ventilation Suspect an alveolar hemorrhage phenomenon in the setting of coagulopathy, deferring bronchoscopy at this time Bronchodilators as needed Follow chest x-ray Unable to do any workup for right lower lobe mass at this time   CARDIOVASCULAR A:  Hypotension in setting of hypovolemia, hemorraghic, or septic shock  New A.Fib  Ischemic Left Foot, status post left femoral embolectomy and fasciotomy on 3/10 Mobile mass noted on mitral valve, suspicious for endocarditis, associated MR P:  Currently on norepinephrine, phenylephrine, epinephrine vasopressin.  No place else to go.  Suspect that she will continue to decline  hemodynamically Left femoral wound care Deferring invasive interventions including any further surgical procedure on left lower extremity Defer TEE for now Continue stress dose steroids She is not a candidate for anticoagulation  RENAL A:   Anion Gap Metabolic Acidosis with Lactic Acidosis, clearing Hypomagnesemia, improved P:   CVVHD running, continues to have progressive lactic acidosis.  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  Now suspect that she has sustained bowel ischemia.  Appreciate gastroenterology assistance, not a candidate for EGD at this time.  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 P:   Change Zosyn to ceftriaxone 3/14 Patient unstable, is not going to be a candidate for TEE  ENDOCRINE A:   Hyperglycemia Hypoglycemia P:   Following glucose on be met Stress dose steroids as  ordered   NEUROLOGIC A:   ETOH withdrawal  H/O ETOH Abuse  Hepatic encephalopathy  P:   RASS goal: -2 Fentanyl Versed as needed Stop lactulose and is unclear that it is going to change mental status or outcome Thiamine and folate completed   FAMILY  - Updates: Family meeting performed 3/13, confirmed on 3/14.  Patient is limited CODE BLUE, no CPR.  They are not prepared to formally withdrawal  - Inter-disciplinary family meet or Palliative Care meeting due by:  07/02/2017  Independent CC time 31 minutes  Baltazar Apo, MD, PhD 06/29/2017, 11:00 AM [facility] [phone] or if no answer [phone]

## 2017-06-29 NOTE — Progress Notes (Signed)
Pharmacy Antibiotic Note Whitney Mathis is a 52 y.o. female admitted on 07/14/2017 with UTI/sepsis/endocarditis.  Pharmacy has been consulted for ceftriaxone dosing.  Plan: 1. Ceftriaxone 2 gram IV every 24 hours  2. Will follow peripherally but sign off on consult at this time.  Height: 5\' 4"  (162.6 cm) Weight: 224 lb 10.4 oz (101.9 kg) IBW/kg (Calculated) : 54.7  Temp (24hrs), Avg:93.2 F (34 C), Min:89.6 F (32 C), Max:97.7 F (36.5 C)  Recent Labs  Lab 07/06/2017 2103  06/26/17 0754 06/26/17 1118  07/15/2017 1018 06/29/2017 1703 06/21/2017 2154 07/04/2017 2220 Aug 25, 2017 0258 Aug 25, 2017 0834 Aug 25, 2017 1127 Aug 25, 2017 1549 06/29/17 0020 06/29/17 0515  WBC 22.0*   < >  --   --    < > 33.9* 45.9*  --  33.4* 25.5*  --  18.3*  --   --   --   CREATININE 1.20*   < >  --   --    < > 4.02* 3.71*  4.16* 3.20*  --  3.16*  --   --  2.46*  --  1.86*  LATICACIDVEN 3.1*  --  2.6* 2.4*  --   --   --   --   --   --  13.0*  --   --  14.7*  --    < > = values in this interval not displayed.    Estimated Creatinine Clearance: 41.6 mL/min (A) (by C-G formula based on SCr of 1.86 mg/dL (H)).    Allergies  Allergen Reactions  . Codeine Nausea And Vomiting    Thank you for allowing pharmacy to be a part of this patient's care.   Azeez Dunker L. Marcy Salvoaymond, PharmD, MS PGY1 Pharmacy Resident Pager: [phone]

## 2017-06-29 NOTE — Progress Notes (Signed)
eLink Physician-Brief Progress Note Patient Name: Whitney Mathis DOB: 1965/09/28 MRN: [id number]   Date of Service  06/29/2017  HPI/Events of Note  MOSF with coagulopathy. Bloody NG tube secretions  eICU Interventions  Check H & H        Marsella Suman U Griffith Santilli 06/29/2017, 12:27 AM

## 2017-06-29 NOTE — Progress Notes (Addendum)
CRITICAL VALUE ALERT  Critical Value:  Calcium 4.4 Potassium 5.4  Date & Time Notied:  06/29/17 1745  Provider Notified: Dr Deterding  Orders Received/Actions taken: See mar- orders carried out. Nursing will continue to monitor.

## 2017-06-29 NOTE — Progress Notes (Signed)
I see that pt is still living but requiring max support of 4 pressors to do so.  It has been determined she will not survive this illness.  Given that we have labs and her bicarb is 30 I will make small adjustment in post filter fluids, change to dialysate.  But if care is withdrawn before then is OK   Whitney Mathis A

## 2017-06-29 NOTE — Progress Notes (Signed)
MD notified of new bright red bloody secretion out of mouth. H&H checked and hemoglobin found to be 6.9. MD notified, no further orders at this time.

## 2017-06-29 NOTE — Progress Notes (Signed)
[facility] Progress Note  Whitney Mathis 52 y.o. [birth year]  CC:  GI bleed, history of cirrhosis   Subjective:  Continues to be hypotensive on maximum pressor support. . Discussed with nursing staff. Evidence of bright blood in the rectal tube. Fresh blood around the endotracheal tube noted.  ROS : Not able to obtain   Objective: Vital signs in last 24 hours: Vitals:   06/29/17 1136 06/29/17 1200  BP: (!) 73/46 (!) 81/66  Pulse: 92   Resp: (!) 32 (!) 29  Temp:    SpO2: 99%     Physical Exam:  Gen. Critically ill-appearing patient. Intubated, sedated Abdomen. Distended. Tense,  Not able to appreciate tenderness, hypoactive bowel sounds Lungs. Coarse breath sounds bilaterally with fine crackles Cardiology.Tachycardia. LE : edema   Lab Results: Recent Labs    06/19/2017 0258 07/04/2017 1549 06/29/17 0515  NA 136 133* 132*  K 5.2* 5.5* 5.2*  CL 94* 84* 80*  CO2 21* 25 30  GLUCOSE 77 61* 76  BUN 27* 18 12  CREATININE 3.16* 2.46* 1.86*  CALCIUM 6.0* 5.2* 4.6*  MG 1.9  --  1.7  PHOS 8.0* 7.3* 6.3*   Recent Labs    07/15/2017 0258 06/17/2017 1549 06/29/17 0515  AST 4,152*  --   --   ALT 487*  --   --   ALKPHOS 162*  --   --   BILITOT 11.7*  --   --   PROT 5.5*  --   --   ALBUMIN 2.1* 2.0* 1.7*   Recent Labs    07/14/2017 0258 06/29/2017 1127 06/29/17 0100  WBC 25.5* 18.3*  --   HGB 7.5* 7.0* 6.9*  HCT 25.3* 22.7* 21.9*  MCV 105.0* 104.1*  --   PLT 88* 69*  --    Recent Labs    07/03/2017 0258 06/24/2017 1127  LABPROT 49.5* 39.1*  INR 5.48* 4.05*      Assessment/Plan: - Decompensated alcoholic cirrhosis. MELD score 40 as of 07/01/2017  - ?? GI bleed. CT scan showing possible upper abdominal varices.  I have discussed with nursing staff multiple times. She has reported no melena or hematochezia. - Multi organ system failure - Abnormal LFTs with AST >4000,  Coagulopathy with elevated INR  Probably ischemic hepatitis - Respiratory failure.  Currently intubated -  bacterial endocarditis - Acute kidney injury - Electrolyte disturbance  - Septic shock. Currently on pressor support - Left lower extent ischemia. S/P embolectomy and 4 compartment fasciotomy early this morning.  Recommendations -------------------------- - patient continues to be on maximum pressor support and remains hypotensive. There is a fresh blood around the endotracheal tube site but no black stool or red blood in the rectal tube.d/w  with the nursing staff. - Continues to have  poor prognosis. - repeat INR , CBC and LFTs tomorrow - GI will follow   Kathi DerParag Jahki Witham MD, FACP 06/29/2017, 1:29 PM  Contact #  [phone]

## 2017-06-29 NOTE — Progress Notes (Signed)
Dr. Darrick Pennaeterding notified of critical values Ca 4.9 and K 5.9.    Orders received to administer 2g Calcium Gluconate and to change dialysate bag to 0k/2.5Ca.

## 2017-06-29 NOTE — Progress Notes (Signed)
Pt.'s husband stated he has spoken with his son's and they have come to the decision to withdraw from care tomorrow at noon if the pt. does not improve by that time. Per pt.'s husband improvement would need to be "substantial" for example, if pt.'s kidneys start working appropriately.

## 2017-06-29 NOTE — Care Management Note (Signed)
Case Management Note  Patient Details  Name: Lorra Halsammy Pitter MRN: [id number] Date of Birth: 08/23/1965  Subjective/Objective:   Pt admitted with UTI/sepsis and endocarditis                   Action/Plan:   PTA from home with husband.  GOC discussion held yesterday - continue current care but pt has  grim prognosis.  Pt remains ventilated.  CM will continue to follow   Expected Discharge Date:                  Expected Discharge Plan:     In-House Referral:  Clinical Social Work  Discharge planning Services  CM Consult  Post Acute Care Choice:    Choice offered to:     DME Arranged:    DME Agency:     HH Arranged:    HH Agency:     Status of Service:     If discussed at [employer] Meetings, dates discussed:    Additional Comments:  Cherylann ParrClaxton, Makenzie Weisner S, RN 06/29/2017, 11:48 AM

## 2017-06-29 NOTE — Progress Notes (Signed)
Hypoglycemic Event  CBG: 67  Treatment: 1/2 amp (25ml) D50  Symptoms: Asymptomatic/ Pt. unresponsive at baseline  Follow-up CBG: Time: 99 CBG Result: 1656  Possible Reasons for Event: Shock/Sepsis   Comments/MD notified: Nursing will monitor    Helyn NumbersMorgan S Ricci Paff

## 2017-06-30 LAB — MAGNESIUM: Magnesium: 2.1 mg/dL (ref 1.7–2.4)

## 2017-06-30 LAB — BLOOD GAS, ARTERIAL
Acid-base deficit: 3.9 mmol/L — ABNORMAL HIGH (ref 0.0–2.0)
BICARBONATE: 21.2 mmol/L (ref 20.0–28.0)
DRAWN BY: 419771
FIO2: 100
LHR: 16 {breaths}/min
MECHVT: 450 mL
O2 Saturation: 98.8 %
PEEP: 5 cmH2O
PO2 ART: 153 mmHg — AB (ref 83.0–108.0)
pCO2 arterial: 39 mmHg (ref 32.0–48.0)
pH, Arterial: 7.347 — ABNORMAL LOW (ref 7.350–7.450)

## 2017-06-30 LAB — RENAL FUNCTION PANEL
ALBUMIN: 1.1 g/dL — AB (ref 3.5–5.0)
Anion gap: 16 — ABNORMAL HIGH (ref 5–15)
BUN: 5 mg/dL — ABNORMAL LOW (ref 6–20)
CALCIUM: 5 mg/dL — AB (ref 8.9–10.3)
CO2: 20 mmol/L — ABNORMAL LOW (ref 22–32)
CREATININE: 1.29 mg/dL — AB (ref 0.44–1.00)
Chloride: 93 mmol/L — ABNORMAL LOW (ref 101–111)
GFR, EST AFRICAN AMERICAN: 55 mL/min — AB (ref >60)
GFR, EST NON AFRICAN AMERICAN: 47 mL/min — AB (ref >60)
Glucose, Bld: 70 mg/dL (ref 65–99)
PHOSPHORUS: 7.6 mg/dL — AB (ref 2.5–4.6)
Potassium: 6.6 mmol/L (ref 3.5–5.1)
SODIUM: 129 mmol/L — AB (ref 135–145)

## 2017-06-30 LAB — HEPATIC FUNCTION PANEL
ALT: 975 U/L — ABNORMAL HIGH (ref 14–54)
AST: 8301 U/L — ABNORMAL HIGH (ref 15–41)
Albumin: 1.1 g/dL — ABNORMAL LOW (ref 3.5–5.0)
Alkaline Phosphatase: 389 U/L — ABNORMAL HIGH (ref 38–126)
BILIRUBIN INDIRECT: 3.8 mg/dL — AB (ref 0.3–0.9)
Bilirubin, Direct: 3.2 mg/dL — ABNORMAL HIGH (ref 0.1–0.5)
Total Bilirubin: 7 mg/dL — ABNORMAL HIGH (ref 0.3–1.2)
Total Protein: <3 g/dL — ABNORMAL LOW (ref 6.5–8.1)

## 2017-06-30 LAB — GLUCOSE, CAPILLARY: GLUCOSE-CAPILLARY: 79 mg/dL (ref 65–99)

## 2017-06-30 LAB — PROTIME-INR: INR: >10

## 2017-06-30 LAB — LACTIC ACID, PLASMA: LACTIC ACID, VENOUS: 9.6 mmol/L — AB (ref 0.5–1.9)

## 2017-06-30 MED ORDER — FAMOTIDINE IN NACL 20-0.9 MG/50ML-% IV SOLN
20.0000 mg | Freq: Every day | INTRAVENOUS | Status: DC
  Administered 2017-06-30: 20 mg via INTRAVENOUS
  Filled 2017-06-30: qty 50

## 2017-07-03 LAB — PROTIME-INR
INR: 4.05 — AB
PROTHROMBIN TIME: 39.1 s — AB (ref 11.4–15.2)

## 2017-07-04 ENCOUNTER — Telehealth: Payer: Self-pay

## 2017-07-04 NOTE — Telephone Encounter (Signed)
On 07/04/17 I [facility] a [facility] The d/c is for cremation. The patient is a patient of Doctor Byrum. The d/c will be taken to Pulmonary Unit @ [facility] for signature.  On 07/06/17 I received the d/c back from Doctor Byrum.  I got the d/c ready and faxed the d/c to the funeral home per the funeral home request.

## 2017-07-17 ENCOUNTER — Telehealth: Payer: Self-pay

## 2017-07-17 NOTE — Progress Notes (Signed)
  CDS called with time of death.  Tubings, IVs removed. Pt cleaned. Pt belongings with the husband. Son and husband at the bedside. Emotional support was given.

## 2017-07-17 NOTE — Telephone Encounter (Signed)
On 07/17/17 [facility] The d/c is for cremation. The patient is a patient of Doctor Byrum. The d/c will be taken to Pulmonary Unit for signature.  On 07/18/17 I received the d/c back from Doctor Byrum. I got the d/c ready and called the funeral home to let them know I mailed the d/c to vital records per the funeral home request.

## 2017-07-17 NOTE — Progress Notes (Signed)
Time of death 100649.  Pt passed away with spouse at bedside.  Pt with fixed and dilated pupils, absence of respirations, absence of heart tones.  Asystole on the monitor, strip printed out and placed in chart.  [facility]

## 2017-07-17 NOTE — Progress Notes (Signed)
Responded to a page for the spouse of this patient.  Patient is actively dying and spouse is at bedside.  Spouse states he and his wife, the patient are believers in God and he as been reading her bible to her.  We prayed together and I provided a space for him to share stories about her.  Will follow up as needed.      07/01/2017 0618  Clinical Encounter Type  Visited With Patient and family together  Visit Type Initial;Spiritual support;Critical Care;Patient actively dying  Spiritual Encounters  Spiritual Needs Prayer;Emotional;Grief support

## 2017-07-17 NOTE — Progress Notes (Signed)
Remaining 180 mls of fentanyl drip wasted into sink.  Witnessed by Dan MakerJames Tambi Thole, RN and Marica OtterMaria Withrow, RN

## 2017-07-17 DEATH — deceased

## 2017-08-16 NOTE — Death Summary Note (Signed)
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 [facility] with encephalopathy, weakness and L LE ischemia. She was found to have shock, progressive respiratory distress, melena, leukocytosis. She was stabilized with intubation and ventilation, [facility] She underwent L LE embolectomy and fasciotomy. Post-op course was complicated by shock, GI and pulmonary hemorrhage, progressive renal failure and acidosis. Pressors were continued and CVVHD initiated. Blood cx and the LLE thrombus both grew coag negative staph, suggestive of endocarditis. Echocardiogram confirmed mitral valve endocarditis. She developed bilateral pulm infiltrates and ARDS. Low VT ventilation was not possible due to severe acidosis. Her pressor needs increased but she never stabilized, continued to hemorrhage. Given her poor prognosis her code status was changed to DNR. She devolved to PEA and expired 06/16/2017.    Levy Pupaobert Ramya Vanbergen, MD, PhD 07/26/2017, 10:14 PM [facility] [phone] or if no answer [phone]

## 2018-08-23 IMAGING — DX DG CHEST 2V
2 series · 2 of 2 positions shown · non-contrast
Comparison: 09/08/2016

CLINICAL DATA: Short of breath. Wheezing. Cirrhosis. Alcohol
dependence.

EXAM:
CHEST - 2 VIEW

[chest lat]
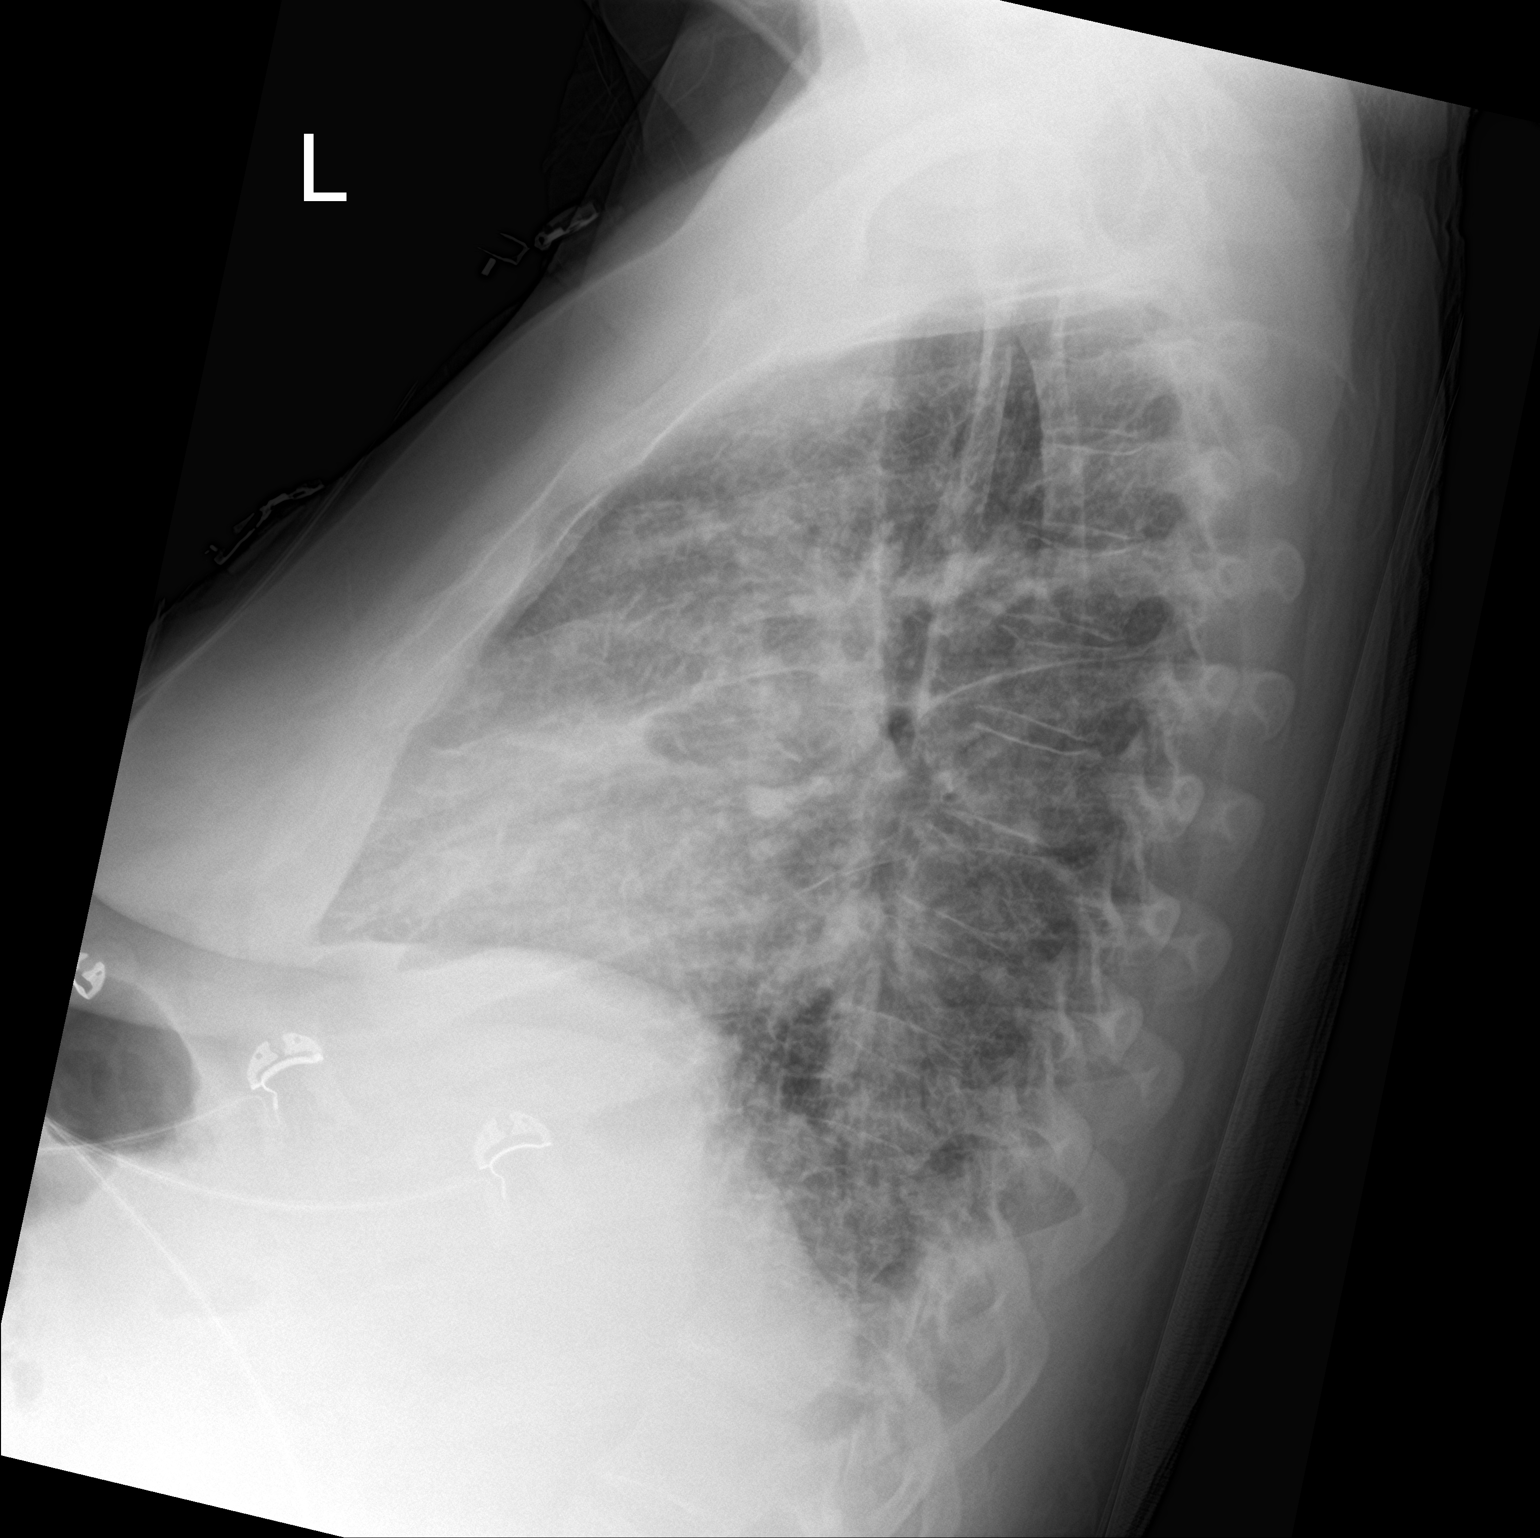

[chest ap]
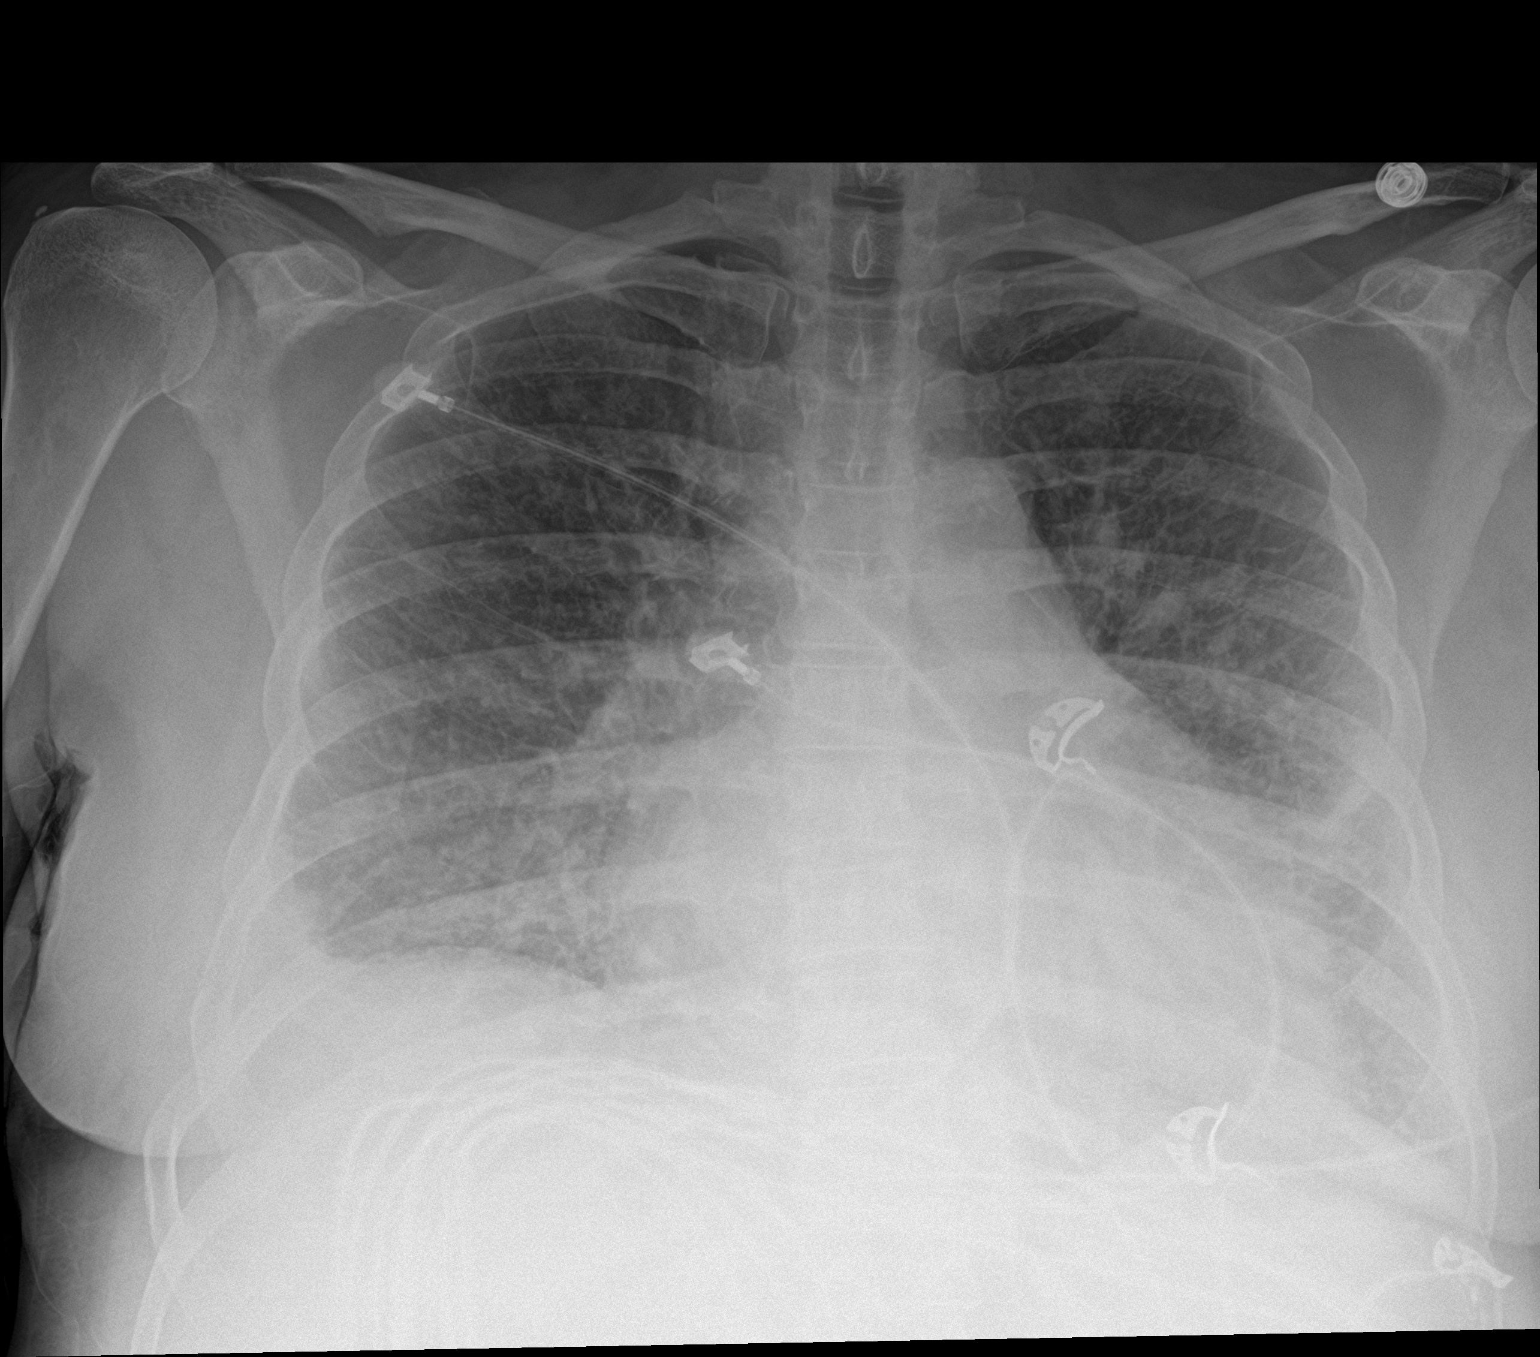

[2 of 2 positions shown; findings below may reference images not displayed]

FINDINGS: Midline trachea. Cardiomegaly, accentuated by AP portable frontal
technique. Small right pleural effusion versus pleural thickening,
blunting the right costophrenic angle. No pneumothorax. Moderate
pulmonary interstitial prominence and indistinctness. No lobar
consolidation.
IMPRESSION: Cardiomegaly with moderate interstitial edema. Small right pleural
effusion versus pleural thickening.

## 2018-08-23 IMAGING — CR DG CHEST 1V PORT
1 series · 1 of 1 positions shown · non-contrast
Comparison: 06/25/2017 at 4741 hours

CLINICAL DATA: Status post intubation.

EXAM:
PORTABLE CHEST 1 VIEW

[ap portable]
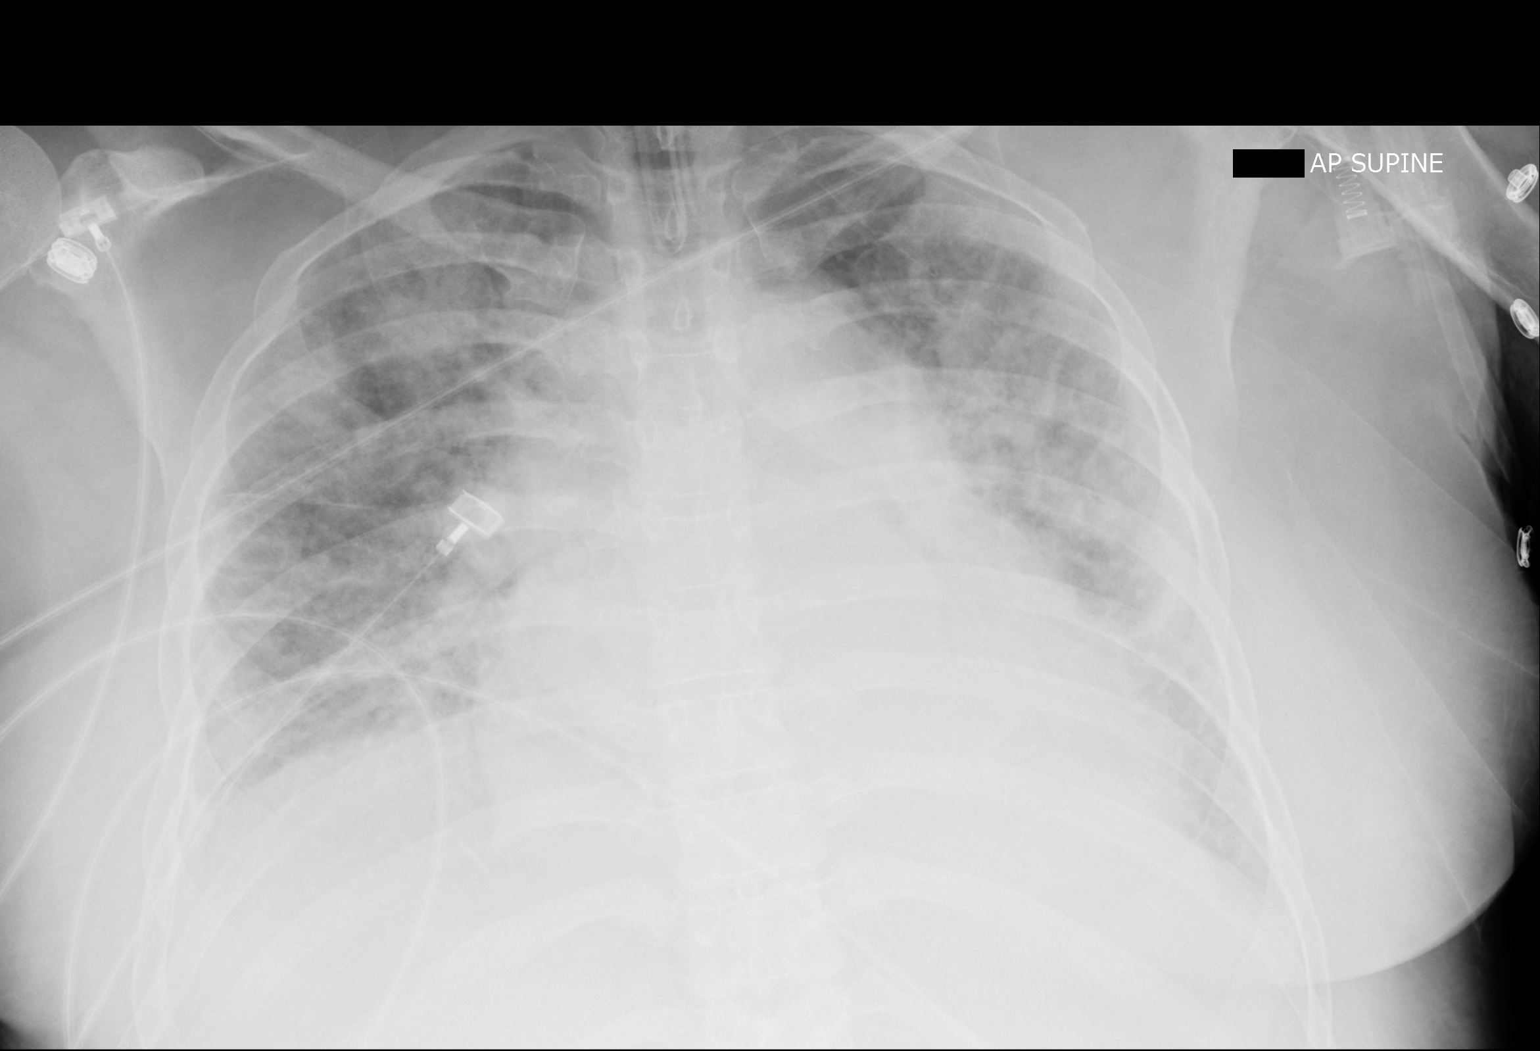

[1 of 1 positions shown; findings below may reference images not displayed]

FINDINGS: Endotracheal tube tip projects 3.8 cm above the carina.

Bilateral airspace lung opacities appear mildly increased when
compared to the earlier exam.
IMPRESSION: 1. Endotracheal tube tip well positioned 3.8 cm above the carina.
2. Worsened pulmonary edema since the earlier study.

## 2018-08-23 IMAGING — DX DG CHEST 1V PORT
1 series · 1 of 1 positions shown · non-contrast
Comparison: Chest radiograph performed earlier today at [DATE] p.m.

CLINICAL DATA: Assess endotracheal tube position.

EXAM:
PORTABLE CHEST 1 VIEW

[chest ap]
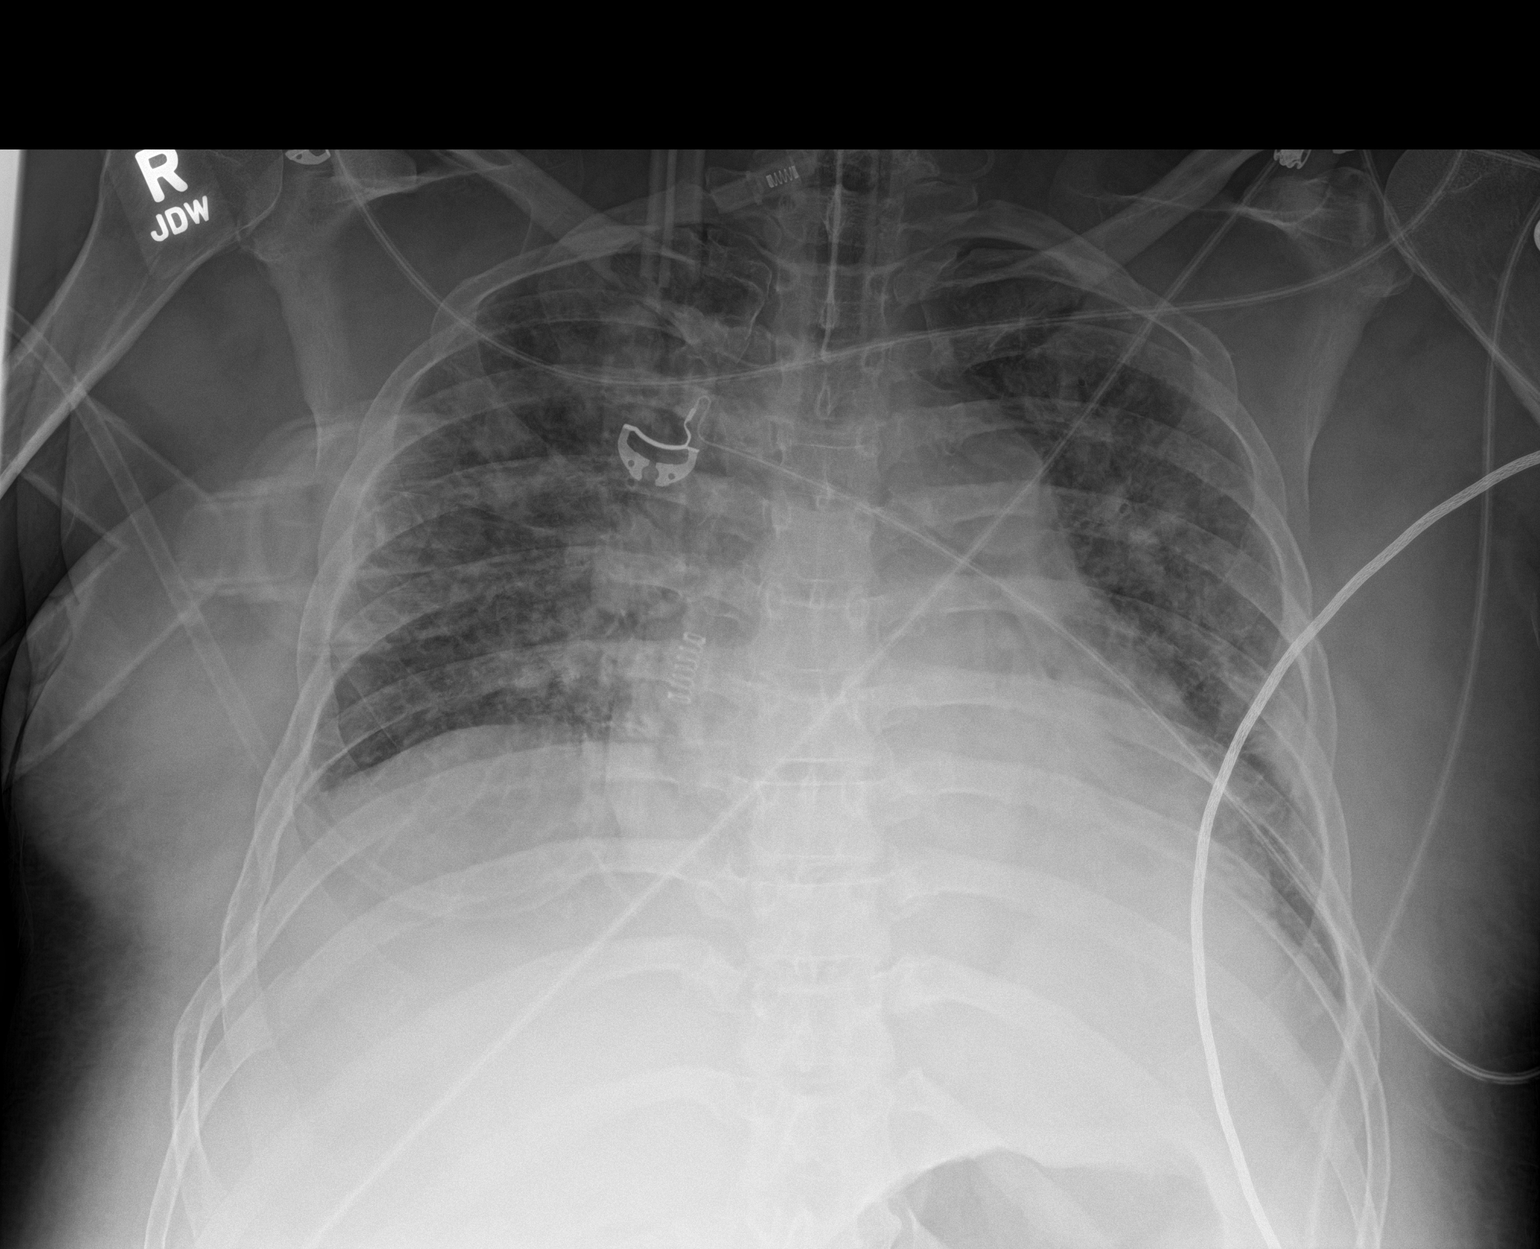

[1 of 1 positions shown; findings below may reference images not displayed]

FINDINGS: The patient's endotracheal tube is seen ending 2-3 cm above the
carina.

The lungs are hypoexpanded. Patchy bilateral airspace opacities are
improved from the prior study and may reflect mild pulmonary edema
or pneumonia. No definite pleural effusion or pneumothorax is seen.

The cardiomediastinal silhouette is borderline enlarged. No acute
osseous abnormalities are identified.
IMPRESSION: 1. Endotracheal tube seen ending 2-3 cm above the carina.
2. Lungs remain hypoexpanded. Patchy bilateral airspace opacities
are improved from the recent prior study and may reflect mild
pulmonary edema or pneumonia.
3. Borderline cardiomegaly.
